# Patient Record
Sex: Female | Born: 1995 | Race: Black or African American | Hispanic: No | Smoking: Never smoker
Health system: Southern US, Community
[De-identification: ages and names within clinical notes are randomized; demographics above are authoritative.]

## PROBLEM LIST (undated history)

## (undated) ENCOUNTER — Inpatient Hospital Stay: Payer: Self-pay

## (undated) DIAGNOSIS — J45909 Unspecified asthma, uncomplicated: Secondary | ICD-10-CM

## (undated) HISTORY — PX: OTHER SURGICAL HISTORY: SHX169

## (undated) HISTORY — DX: Unspecified asthma, uncomplicated: J45.909

## (undated) HISTORY — PX: NO PAST SURGERIES: SHX2092

---
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
## 2016-11-11 ENCOUNTER — Encounter: Payer: Self-pay | Admitting: *Deleted

## 2016-11-11 ENCOUNTER — Emergency Department
Admission: EM | Admit: 2016-11-11 | Discharge: 2016-11-11 | Disposition: A | Discharge status: Home / Self Care | Payer: Medicaid Other | Attending: Emergency Medicine | Admitting: Emergency Medicine

## 2016-11-11 DIAGNOSIS — N760 Acute vaginitis: Secondary | ICD-10-CM | POA: Insufficient documentation

## 2016-11-11 DIAGNOSIS — F1290 Cannabis use, unspecified, uncomplicated: Secondary | ICD-10-CM | POA: Insufficient documentation

## 2016-11-11 DIAGNOSIS — B9689 Other specified bacterial agents as the cause of diseases classified elsewhere: Secondary | ICD-10-CM

## 2016-11-11 DIAGNOSIS — Z79899 Other long term (current) drug therapy: Secondary | ICD-10-CM | POA: Insufficient documentation

## 2016-11-11 LAB — CHLAMYDIA/NGC RT PCR (ARMC ONLY)
Chlamydia Tr: NOT DETECTED
N GONORRHOEAE: NOT DETECTED

## 2016-11-11 LAB — URINALYSIS, COMPLETE (UACMP) WITH MICROSCOPIC
Bacteria, UA: NONE SEEN
Bilirubin Urine: NEGATIVE
Glucose, UA: NEGATIVE mg/dL
Ketones, ur: NEGATIVE mg/dL
Leukocytes, UA: NEGATIVE
Nitrite: NEGATIVE
PROTEIN: NEGATIVE mg/dL
Specific Gravity, Urine: 1.021 (ref 1.005–1.030)
pH: 7 (ref 5.0–8.0)

## 2016-11-11 LAB — WET PREP, GENITAL
Sperm: NONE SEEN
Trich, Wet Prep: NONE SEEN
YEAST WET PREP: NONE SEEN

## 2016-11-11 LAB — POCT PREGNANCY, URINE: Preg Test, Ur: NEGATIVE

## 2016-11-11 MED ORDER — METRONIDAZOLE 500 MG PO TABS
500.0000 mg | ORAL_TABLET | Freq: Two times a day (BID) | ORAL | 0 refills | Status: DC
Start: 2016-11-11 — End: 2018-02-02

## 2016-11-11 NOTE — ED Provider Notes (Signed)
[facility] Emergency Department Provider Note  ____________________________________________   First MD Initiated Contact with Patient 11/11/16 1320     (approximate)  I have reviewed the triage vital signs and the nursing notes.   HISTORY  Chief Complaint Vaginal Discharge    HPI Gabrielle Howell is a [age] y.o. female is here with complaint of vaginal discharge for "a couple months". Patient states that she has had some brown discharge that calms every other week. She is complains of an odor with the discharge. Patient was placed on birth control at the [facility] and patient has not been there since October 2016.   History reviewed. No pertinent past medical history.  Patient Active Problem List   Diagnosis Date Noted  . Labor and delivery indication for care or intervention 06/09/2015  . Indication for care or intervention related to labor and delivery 06/09/2015    Past Surgical History:  Procedure Laterality Date  . NO PAST SURGERIES      Prior to Admission medications   Medication Sig Start Date End Date Taking? Authorizing Provider  ibuprofen (ADVIL,MOTRIN) 600 MG tablet Take 1 tablet (600 mg total) by mouth every 6 (six) hours. 06/11/15   Ihor Austinhomas J Schermerhorn, MD  metroNIDAZOLE (FLAGYL) 500 MG tablet Take 1 tablet (500 mg total) by mouth 2 (two) times daily. 11/11/16   Tommi Rumpshonda L Chaz Mcglasson, PA-C  Prenatal Vit-Fe Fumarate-FA (MULTIVITAMIN-PRENATAL) 27-0.8 MG TABS tablet Take 1 tablet by mouth daily at 12 noon.    Historical Provider, MD    Allergies Patient has no known allergies.  History reviewed. No pertinent family history.  Social History Social History  Substance Use Topics  . Smoking status: Never Smoker  . Smokeless tobacco: Never Used  . Alcohol use No    Review of Systems Constitutional: No fever/chills Cardiovascular: Denies chest pain. Respiratory: Denies shortness of breath. Gastrointestinal:  No abdominal pain.  No nausea, no vomiting.  Genitourinary: Negative for dysuria. Positive vaginal discharge. Musculoskeletal: Negative for back pain. Skin: Negative for rash. Neurological: Negative for headaches, focal weakness or numbness.  10-point ROS otherwise negative.  ____________________________________________   PHYSICAL EXAM:  VITAL SIGNS: ED Triage Vitals [11/11/16 1256]  Enc Vitals Group     BP 108/63     Pulse Rate 78     Resp 18     Temp 98 F (36.7 C)     Temp Source Oral     SpO2 100 %     Weight 180 lb (81.6 kg)     Height 5\' 5"  (1.651 m)     Head Circumference      Peak Flow      Pain Score 5     Pain Loc      Pain Edu?      Excl. in GC?     Constitutional: Alert and oriented. Well appearing and in no acute distress. Eyes: Conjunctivae are normal. PERRL. EOMI. Head: Atraumatic. Nose: No congestion/rhinnorhea. Neck: No stridor.   Cardiovascular: Normal rate, regular rhythm. Grossly normal heart sounds.  Good peripheral circulation. Respiratory: Normal respiratory effort.  No retractions. Lungs CTAB. Gastrointestinal: Soft and nontender. No distention.  Genitourinary: External vaginal exam is unremarkable. Vaginal exam showed dark menstrual blood. Minimal adnexal tenderness but no masses were noted. There is minimal cervical tenderness but negative chandelier sign. Musculoskeletal: No lower extremity tenderness nor edema.  No joint effusions. Neurologic:  Normal speech and language. No gross focal neurologic deficits are appreciated. No gait  instability. Skin:  Skin is warm, dry and intact. No rash noted. Psychiatric: Mood and affect are normal. Speech and behavior are normal.  ____________________________________________   LABS (all labs ordered are listed, but only abnormal results are displayed)  Labs Reviewed  WET PREP, GENITAL - Abnormal; Notable for the following:       Result Value   Clue Cells Wet Prep HPF POC PRESENT (*)    WBC, Wet  Prep HPF POC FEW (*)    All other components within normal limits  URINALYSIS, COMPLETE (UACMP) WITH MICROSCOPIC - Abnormal; Notable for the following:    Color, Urine YELLOW (*)    APPearance CLEAR (*)    Hgb urine dipstick SMALL (*)    Squamous Epithelial / LPF 0-5 (*)    All other components within normal limits  CHLAMYDIA/NGC RT PCR ([facility] ONLY)  POC URINE PREG, ED  POCT PREGNANCY, URINE     PROCEDURES  Procedure(s) performed: None  Procedures  Critical Care performed: No  ____________________________________________   INITIAL IMPRESSION / ASSESSMENT AND PLAN / ED COURSE  Pertinent labs & imaging results that were available during my care of the patient were reviewed by me and considered in my medical decision making (see chart for details).  Wet prep was positive for clue cells. Patient was made aware that she has a bacterial vaginosis. Phone number was confirmed and patient was told that should her Chlamydia or gonorrhea test be positive she would be notified. She is encouraged to follow-up with the [facility] health Department for any continued problems with vaginal discharge. Patient was placed on Flagyl 500 mg twice a day for 7 days.      ____________________________________________   FINAL CLINICAL IMPRESSION(S) / ED DIAGNOSES  Final diagnoses:  BV (bacterial vaginosis)      NEW MEDICATIONS STARTED DURING THIS VISIT:  Discharge Medication List as of 11/11/2016  3:17 PM    START taking these medications   Details  metroNIDAZOLE (FLAGYL) 500 MG tablet Take 1 tablet (500 mg total) by mouth 2 (two) times daily., Starting Wed 11/11/2016, Print         Note:  This document was prepared using Dragon voice recognition software and may include unintentional dictation errors.    Tommi Rumps, PA-C 11/11/16 1749    Jeanmarie Plant, MD 11/19/16 [phone]

## 2016-11-11 NOTE — ED Triage Notes (Signed)
States brown foul smelling vaginal discharge for a couple of months

## 2016-11-11 NOTE — ED Notes (Signed)
Pt states placed on BC after having children. Pt states pelvic pain and brown discharge. [facility] Pain comes and goes, discharge "every other week." Pt has mirena.

## 2016-11-11 NOTE — Discharge Instructions (Signed)
Follow[facility]. Been taking Flagyl as directed. Do not drink alcohol or take medications containing alcohol such as cough medication. Cultures for gonorrhea and chlamydia have not resulted today and usually called if further treatment is indicated.

## 2016-12-30 IMAGING — US TRANSABDOMINAL ULTRASOUND OF PELVIS
1 series · 14 of 25 positions shown · non-contrast
Comparison: No priors.

CLINICAL DATA: 18-year-old female with history of pelvic pain since
this morning.

EXAM:
TRANSABDOMINAL ULTRASOUND OF PELVIS
TECHNIQUE: Transabdominal ultrasound examination of the pelvis was performed
including evaluation of the uterus, ovaries, adnexal regions, and
pelvic cul-de-sac.

[Series 1: transabdominal ultrasound of pelvis · 0.17mm/px · 14 of 40 slices shown]
[im 1/40]
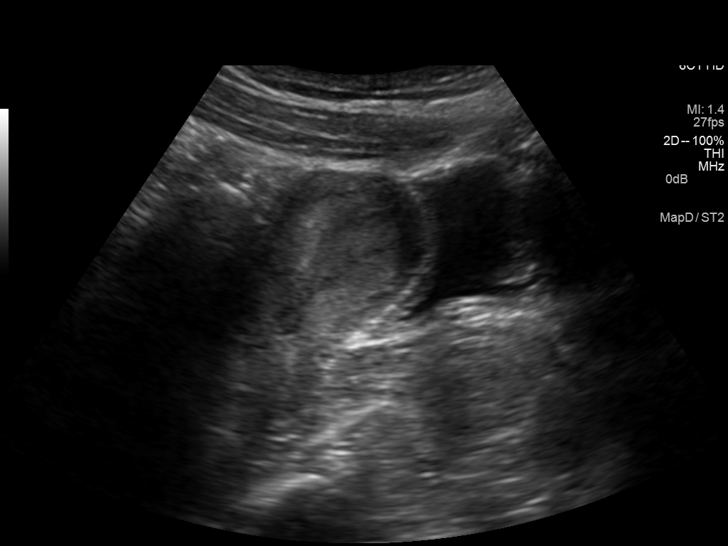
[im 4/40]
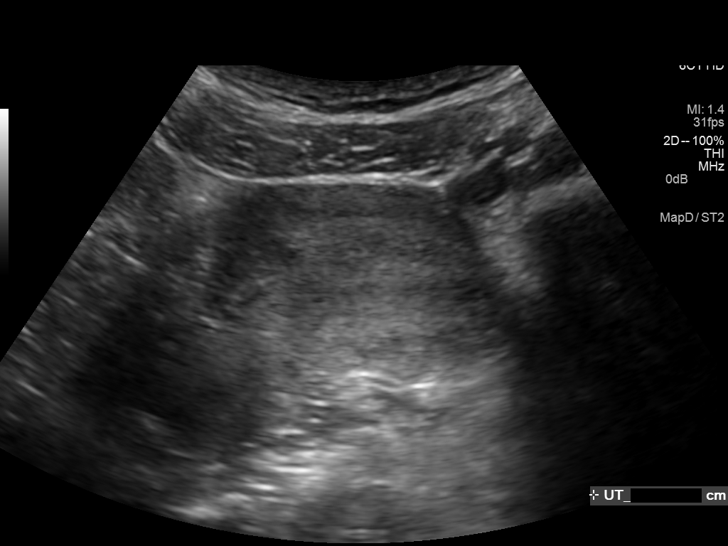
[im 7/40]
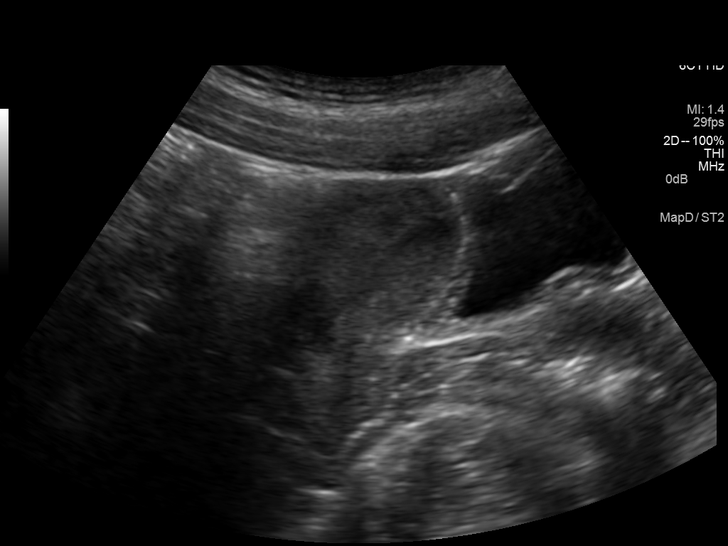
[im 10/40]
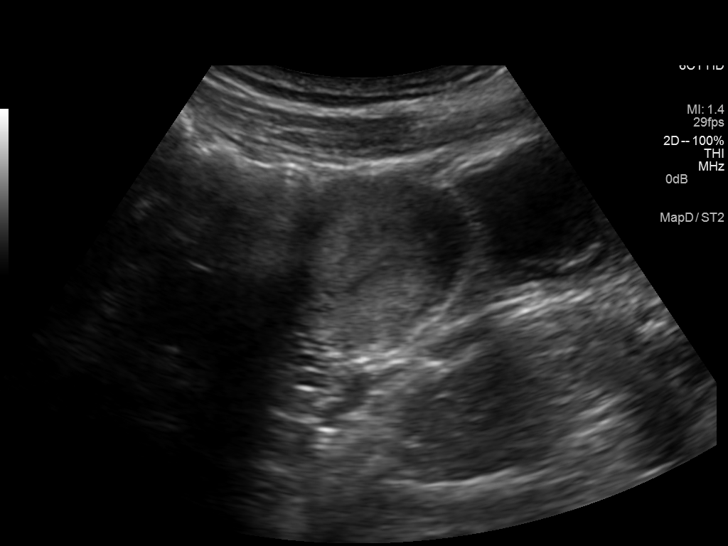
[im 14/40]
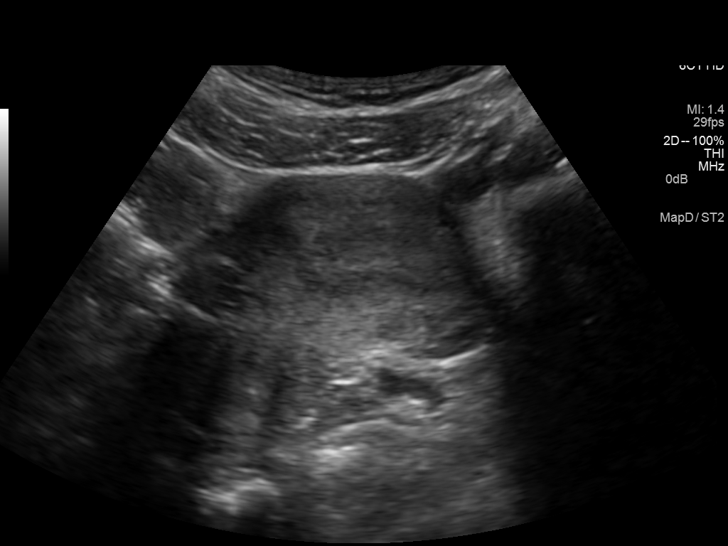
[im 15/40]
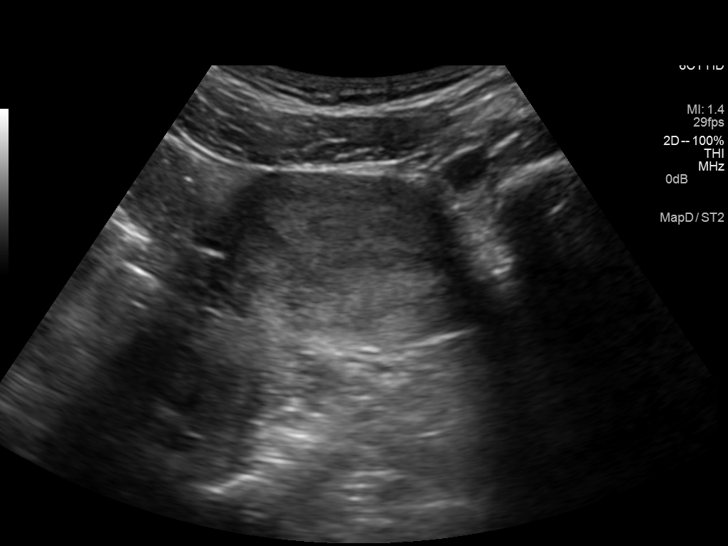
[im 18/40]
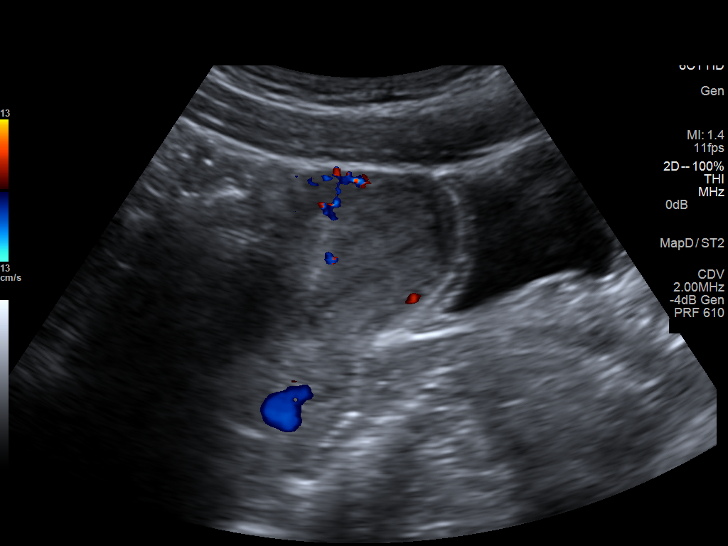
[im 22/40]
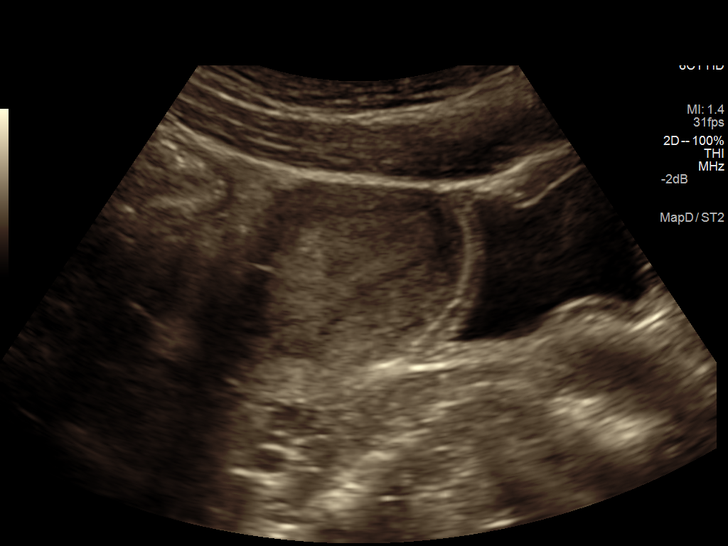
[im 25/40]
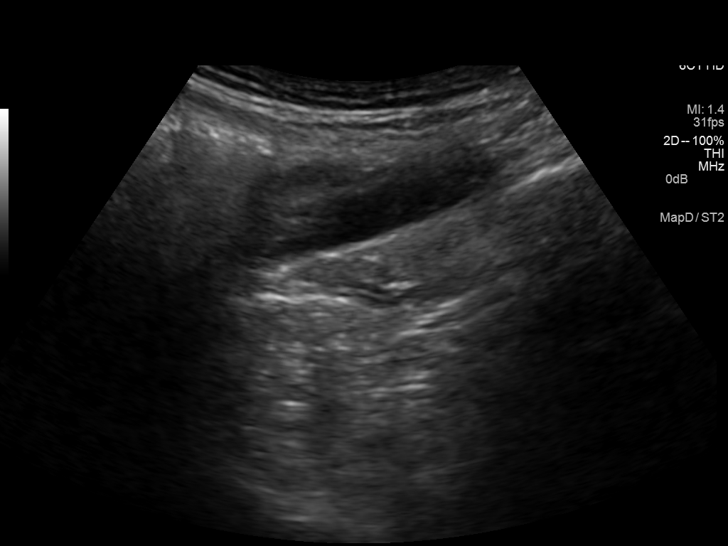
[im 27/40]
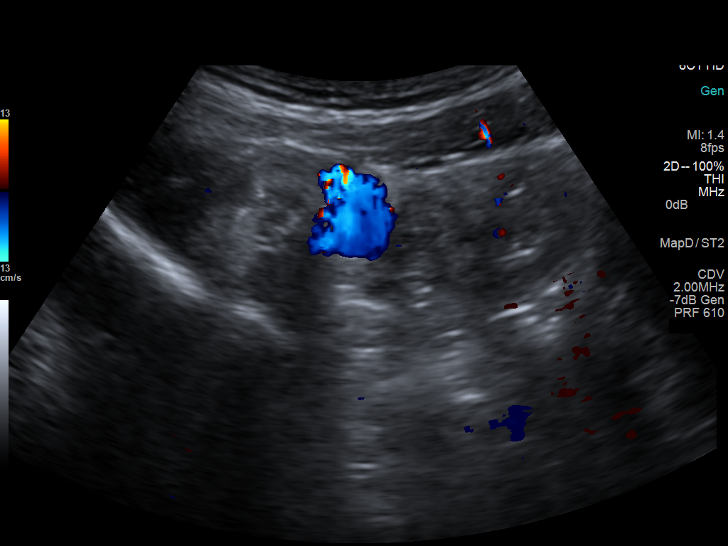
[im 30/40]
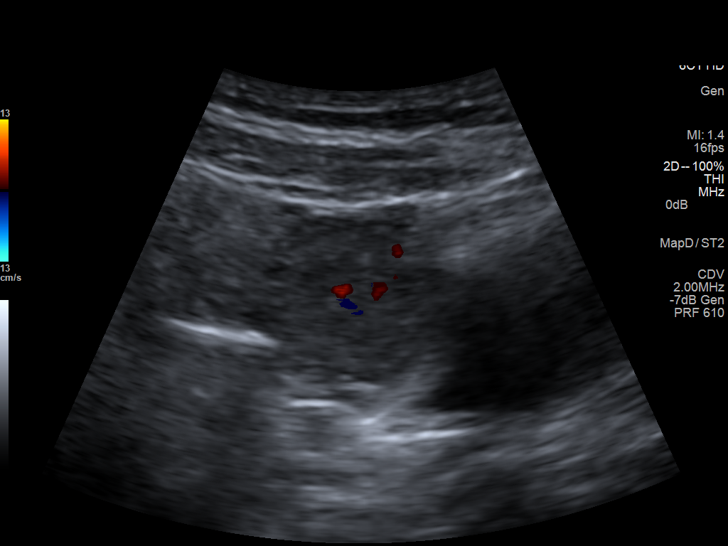
[im 33/40]
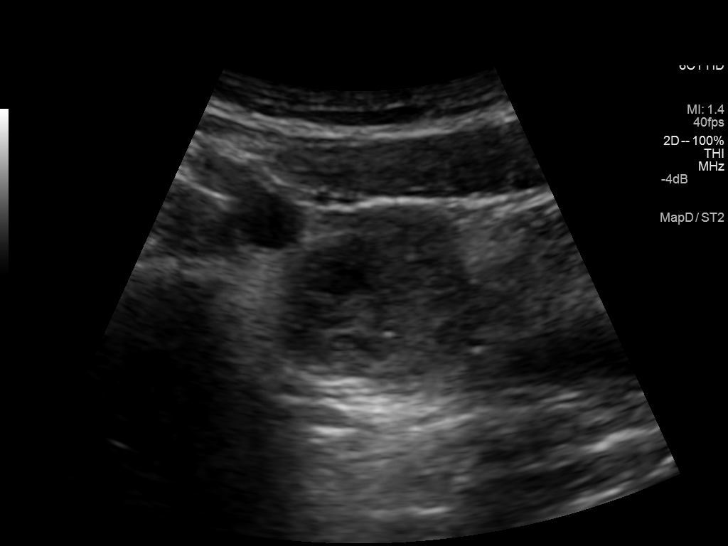
[im 36/40]
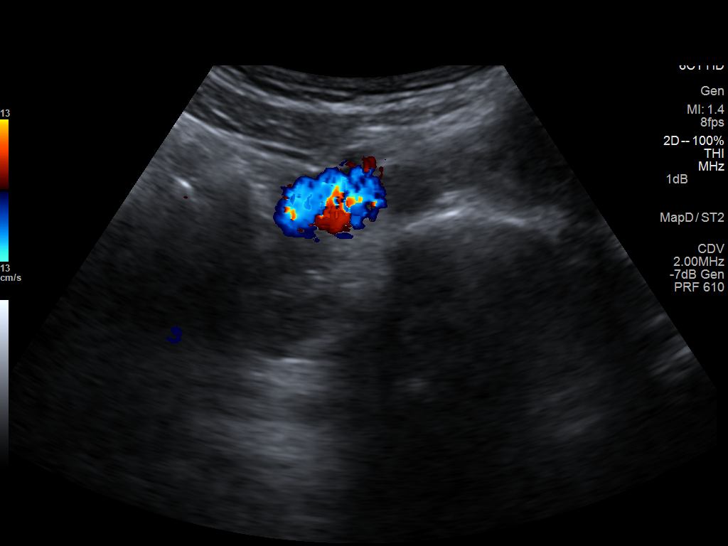
[im 40/40]
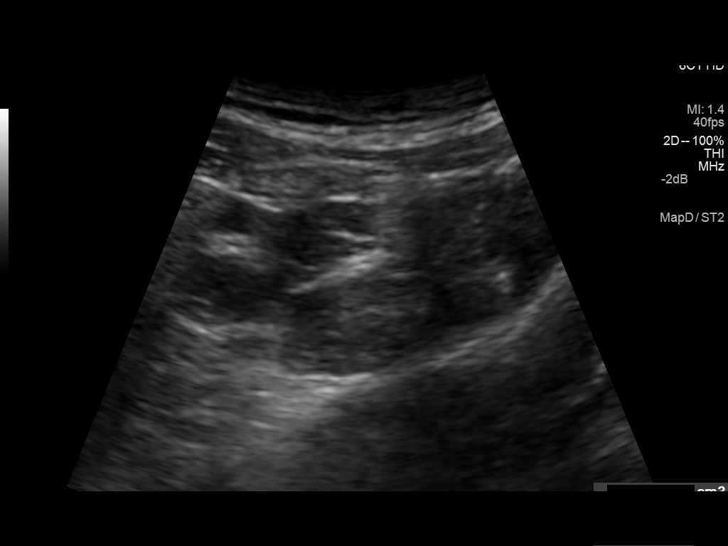

[14 of 25 positions shown; findings below may reference images not displayed]

FINDINGS: Uterus

Measurements: 7.3 x 3.8 x 5.7 cm. No fibroids or other mass
visualized.

Endometrium

Thickness: 15 mm.  No focal abnormality visualized.

Right ovary

Measurements: 3.0 x 2.5 x 2.7 cm. Normal appearance/no adnexal mass.

Left ovary

Measurements: 2.8 x 2.4 x 1.5 cm. Normal appearance/no adnexal mass.

Other findings:  No free fluid
IMPRESSION: 1. Normal transabdominal pelvic ultrasound.

## 2017-10-09 LAB — OB RESULTS CONSOLE GC/CHLAMYDIA: Gonorrhea: NEGATIVE

## 2018-02-02 ENCOUNTER — Other Ambulatory Visit: Payer: Self-pay

## 2018-02-02 ENCOUNTER — Emergency Department
Admission: EM | Admit: 2018-02-02 | Discharge: 2018-02-02 | Disposition: A | Discharge status: Home / Self Care | Payer: Self-pay | Attending: Emergency Medicine | Admitting: Emergency Medicine

## 2018-02-02 ENCOUNTER — Emergency Department: Payer: Self-pay

## 2018-02-02 ENCOUNTER — Encounter: Payer: Self-pay | Admitting: Emergency Medicine

## 2018-02-02 DIAGNOSIS — N760 Acute vaginitis: Secondary | ICD-10-CM | POA: Insufficient documentation

## 2018-02-02 DIAGNOSIS — R102 Pelvic and perineal pain: Secondary | ICD-10-CM

## 2018-02-02 DIAGNOSIS — A549 Gonococcal infection, unspecified: Secondary | ICD-10-CM | POA: Insufficient documentation

## 2018-02-02 DIAGNOSIS — B9689 Other specified bacterial agents as the cause of diseases classified elsewhere: Secondary | ICD-10-CM | POA: Insufficient documentation

## 2018-02-02 LAB — COMPREHENSIVE METABOLIC PANEL
ALBUMIN: 4.1 g/dL (ref 3.5–5.0)
ALT: 15 U/L (ref 14–54)
ANION GAP: 6 (ref 5–15)
AST: 21 U/L (ref 15–41)
Alkaline Phosphatase: 44 U/L (ref 38–126)
BUN: 12 mg/dL (ref 6–20)
CO2: 28 mmol/L (ref 22–32)
Calcium: 9.2 mg/dL (ref 8.9–10.3)
Chloride: 102 mmol/L (ref 101–111)
Creatinine, Ser: 0.62 mg/dL (ref 0.44–1.00)
GFR calc Af Amer: >60 mL/min (ref >60)
GFR calc non Af Amer: >60 mL/min (ref >60)
GLUCOSE: 89 mg/dL (ref 65–99)
POTASSIUM: 3.9 mmol/L (ref 3.5–5.1)
Sodium: 136 mmol/L (ref 135–145)
Total Bilirubin: 0.8 mg/dL (ref 0.3–1.2)
Total Protein: 7.7 g/dL (ref 6.5–8.1)

## 2018-02-02 LAB — POCT PREGNANCY, URINE: PREG TEST UR: NEGATIVE

## 2018-02-02 LAB — URINALYSIS, COMPLETE (UACMP) WITH MICROSCOPIC
BACTERIA UA: NONE SEEN
BILIRUBIN URINE: NEGATIVE
Glucose, UA: NEGATIVE mg/dL
Hgb urine dipstick: NEGATIVE
KETONES UR: 80 mg/dL — AB
LEUKOCYTES UA: NEGATIVE
Nitrite: NEGATIVE
Protein, ur: NEGATIVE mg/dL
SPECIFIC GRAVITY, URINE: 1.024 (ref 1.005–1.030)
pH: 7 (ref 5.0–8.0)

## 2018-02-02 LAB — CBC WITH DIFFERENTIAL/PLATELET
BASOS PCT: 1 %
Basophils Absolute: 0 10*3/uL (ref 0–0.1)
Eosinophils Absolute: 0.1 10*3/uL (ref 0–0.7)
Eosinophils Relative: 2 %
HCT: 39.6 % (ref 35.0–47.0)
HEMOGLOBIN: 13.8 g/dL (ref 12.0–16.0)
Lymphocytes Relative: 38 %
Lymphs Abs: 1.4 10*3/uL (ref 1.0–3.6)
MCH: 32 pg (ref 26.0–34.0)
MCHC: 34.7 g/dL (ref 32.0–36.0)
MCV: 92.2 fL (ref 80.0–100.0)
MONO ABS: 0.5 10*3/uL (ref 0.2–0.9)
MONOS PCT: 13 %
NEUTROS PCT: 46 %
Neutro Abs: 1.7 10*3/uL (ref 1.4–6.5)
Platelets: 240 10*3/uL (ref 150–440)
RBC: 4.29 MIL/uL (ref 3.80–5.20)
RDW: 13.2 % (ref 11.5–14.5)
WBC: 3.7 10*3/uL (ref 3.6–11.0)

## 2018-02-02 LAB — WET PREP, GENITAL
SPERM: NONE SEEN
Trich, Wet Prep: NONE SEEN
YEAST WET PREP: NONE SEEN

## 2018-02-02 LAB — CHLAMYDIA/NGC RT PCR (ARMC ONLY)
Chlamydia Tr: NOT DETECTED
N gonorrhoeae: DETECTED — AB

## 2018-02-02 LAB — LIPASE, BLOOD: Lipase: 23 U/L (ref 11–51)

## 2018-02-02 MED ORDER — AZITHROMYCIN 500 MG PO TABS
1000.0000 mg | ORAL_TABLET | Freq: Once | ORAL | Status: AC
  Administered 2018-02-02: 1000 mg via ORAL
  Filled 2018-02-02: qty 2

## 2018-02-02 MED ORDER — DOXYCYCLINE HYCLATE 100 MG PO CAPS: 100.0000 mg | ORAL_CAPSULE | Freq: Two times a day (BID) | ORAL | 0 refills | Status: DC

## 2018-02-02 MED ORDER — CEFTRIAXONE SODIUM 250 MG IJ SOLR
INTRAMUSCULAR | Status: AC
  Administered 2018-02-02: 250 mg via INTRAMUSCULAR
  Filled 2018-02-02: qty 250

## 2018-02-02 MED ORDER — METRONIDAZOLE 500 MG PO TABS: 500.0000 mg | ORAL_TABLET | Freq: Two times a day (BID) | ORAL | 0 refills | Status: DC

## 2018-02-02 MED ORDER — DEXTROSE 5 % IV SOLN
250.0000 mg | Freq: Once | INTRAVENOUS | Status: DC
  Filled 2018-02-02: qty 250

## 2018-02-02 MED ORDER — CEFTRIAXONE SODIUM 250 MG IJ SOLR
250.0000 mg | Freq: Once | INTRAMUSCULAR | Status: AC
  Administered 2018-02-02: 250 mg via INTRAMUSCULAR

## 2018-02-02 NOTE — ED Triage Notes (Signed)
Pt to ED via POV c/o pelvic pain since Monday. Pt denies vaginal discharge or bleeding. Pt states that she does have painful urination at times. Pt in NAD at this time.

## 2018-02-02 NOTE — ED Provider Notes (Signed)
[facility] Emergency Department Provider Note  ____________________________________________   I have reviewed the triage vital signs and the nursing notes. Where available I have reviewed prior notes and, if possible and indicated, outside hospital notes.    HISTORY  Chief Complaint Pelvic Pain    HPI JOSHUA ZERINGUE is a 22 y.o. female a history of chronic recurrent pelvic pain, she had an IUD placed, but then had the IUD taken away because she is still having the pain.  She is here with her normal pain.  Is suprapubic.  Denies any nausea or vomiting hematuria, it has been there gradually for the last couple days.  Not worst pain of life.  Not sudden onset.  No radiation.  Nothing makes it better nothing makes it worse.  Denies pregnancy.  Denies vaginal discharge.  Denies any other symptoms.  Nothing makes it better nothing makes it worse.  No antecedent treatment.  Pain is mild to moderate.  This is the same pain she "always gets".  Is sexually active.  Not use any birth control.  No history of STI that she knows of.  She is G1, P0 does not believe she is pregnant.  Last menstrual period was in mid April.     History reviewed. No pertinent past medical history.  Patient Active Problem List   Diagnosis Date Noted  . Labor and delivery indication for care or intervention 06/09/2015  . Indication for care or intervention related to labor and delivery 06/09/2015    Past Surgical History:  Procedure Laterality Date  . NO PAST SURGERIES      Prior to Admission medications   Not on File    Allergies Patient has no known allergies.  No family history on file.  Social History Social History   Tobacco Use  . Smoking status: Never Smoker  . Smokeless tobacco: Never Used  Substance Use Topics  . Alcohol use: No  . Drug use: Yes    Types: Marijuana    Review of Systems Constitutional: No fever/chills Eyes: No visual changes. ENT: No sore  throat. No stiff neck no neck pain Cardiovascular: Denies chest pain. Respiratory: Denies shortness of breath. Gastrointestinal:   no vomiting.  No diarrhea.  No constipation. Genitourinary: Negative for dysuria. Musculoskeletal: Negative lower extremity swelling Skin: Negative for rash. Neurological: Negative for severe headaches, focal weakness or numbness.   ____________________________________________   PHYSICAL EXAM:  VITAL SIGNS: ED Triage Vitals  Enc Vitals Group     BP 02/02/18 0848 120/74     Pulse Rate 02/02/18 0848 72     Resp 02/02/18 0848 18     Temp 02/02/18 0848 (!) 97.5 F (36.4 C)     Temp Source 02/02/18 0848 Oral     SpO2 02/02/18 0848 100 %     Weight 02/02/18 0850 185 lb (83.9 kg)     Height --      Head Circumference --      Peak Flow --      Pain Score 02/02/18 0850 5     Pain Loc --      Pain Edu? --      Excl. in GC? --     Constitutional: Alert and oriented. Well appearing and in no acute distress. Eyes: Conjunctivae are normal Head: Atraumatic HEENT: No congestion/rhinnorhea. Mucous membranes are moist.  Oropharynx non-erythematous Neck:   Nontender with no meningismus, no masses, no stridor Cardiovascular: Normal rate, regular rhythm. Grossly normal heart sounds.  Good  peripheral circulation. Respiratory: Normal respiratory effort.  No retractions. Lungs CTAB. Abdominal: Soft and slight suprapubic discomfort with no guarding no rebound no tenderness at McBurney's point,. No distention. No guarding no rebound Back:  There is no focal tenderness or step off.  there is no midline tenderness there are no lesions noted. there is no CVA tenderness Pelvic exam: Female nurse chaperone present, no external lesions noted, physiologic vaginal discharge noted with no purulent discharge, no cervical motion tenderness, no adnexal tenderness or mass, there is mild uterine tenderness, there is no mass appreciated, no vaginal bleeding Musculoskeletal: No lower  extremity tenderness, no upper extremity tenderness. No joint effusions, no DVT signs strong distal pulses no edema Neurologic:  Normal speech and language. No gross focal neurologic deficits are appreciated.  Skin:  Skin is warm, dry and intact. No rash noted. Psychiatric: Mood and affect are normal. Speech and behavior are normal.  ____________________________________________   LABS (all labs ordered are listed, but only abnormal results are displayed)  Labs Reviewed  WET PREP, GENITAL - Abnormal; Notable for the following components:      Result Value   Clue Cells Wet Prep HPF POC PRESENT (*)    WBC, Wet Prep HPF POC FEW (*)    All other components within normal limits  URINALYSIS, COMPLETE (UACMP) WITH MICROSCOPIC - Abnormal; Notable for the following components:   Color, Urine YELLOW (*)    APPearance CLEAR (*)    Ketones, ur 80 (*)    All other components within normal limits  CHLAMYDIA/NGC RT PCR ([facility] ONLY)  CBC WITH DIFFERENTIAL/PLATELET  COMPREHENSIVE METABOLIC PANEL  LIPASE, BLOOD  POC URINE PREG, ED  POCT PREGNANCY, URINE    Pertinent labs  results that were available during my care of the patient were reviewed by me and considered in my medical decision making (see chart for details). ____________________________________________  EKG  I personally interpreted any EKGs ordered by me or triage  ____________________________________________  RADIOLOGY  Pertinent labs & imaging results that were available during my care of the patient were reviewed by me and considered in my medical decision making (see chart for details). If possible, patient and/or family made aware of any abnormal findings.  No results found. ____________________________________________    PROCEDURES  Procedure(s) performed: None  Procedures  Critical Care performed: None  ____________________________________________   INITIAL IMPRESSION / ASSESSMENT AND PLAN / ED  COURSE  Pertinent labs & imaging results that were available during my care of the patient were reviewed by me and considered in my medical decision making (see chart for details).  Patient with chronic recurrent pelvic pain has been there off and on for several years we have not had imaging on her in several years however.  Will obtain an ultrasound blood work is reassuring, she does have BV which certainly could be the cause of her symptoms.    ____________________________________________   FINAL CLINICAL IMPRESSION(S) / ED DIAGNOSES  Final diagnoses:  Pelvic pain      This chart was dictated using voice recognition software.  Despite best efforts to proofread,  errors can occur which can change meaning.      Jeanmarie Plant, MD 02/02/18 1017

## 2018-02-02 NOTE — ED Notes (Signed)
Pt alert and oriented X4, active, cooperative, pt in NAD. RR even and unlabored, color WNL.  Pt informed to return if any life threatening symptoms occur.  Discharge and followup instructions reviewed.  

## 2018-02-02 NOTE — ED Notes (Signed)
Patient transported to Ultrasound 

## 2018-02-02 NOTE — ED Notes (Signed)
Pt is AOx4, she does not show any signs of distress, in bed with rails upx2. Pelvic exam equipment has been set up. Pt denies any chest pain, shortness of breath or hematuria. We will continue to monitor the pt.

## 2018-02-02 NOTE — Discharge Instructions (Signed)
Please make all of your sexual partners aware of these findings, do not have any further sexual activity with anyone until they are treated.  If you have increased pain, fever vomiting return to the emergency room.  Take Tylenol Motrin for the pain.

## 2018-03-15 LAB — OB RESULTS CONSOLE HIV ANTIBODY (ROUTINE TESTING): HIV: NONREACTIVE

## 2018-03-16 ENCOUNTER — Other Ambulatory Visit (HOSPITAL_COMMUNITY): Payer: Self-pay | Admitting: Family Medicine

## 2018-03-16 DIAGNOSIS — Z369 Encounter for antenatal screening, unspecified: Secondary | ICD-10-CM

## 2018-03-16 LAB — OB RESULTS CONSOLE HEPATITIS B SURFACE ANTIGEN: Hepatitis B Surface Ag: NEGATIVE

## 2018-03-16 LAB — OB RESULTS CONSOLE RPR: RPR: NONREACTIVE

## 2018-04-18 ENCOUNTER — Ambulatory Visit
Admission: RE | Admit: 2018-04-18 | Discharge: 2018-04-18 | Disposition: A | Discharge status: Home / Self Care | Payer: Self-pay | Source: Ambulatory Visit | Attending: Family Medicine | Admitting: Family Medicine

## 2018-04-18 ENCOUNTER — Ambulatory Visit
Admission: RE | Admit: 2018-04-18 | Discharge: 2018-04-18 | Disposition: A | Discharge status: Home / Self Care | Payer: Self-pay | Source: Ambulatory Visit | Attending: Maternal & Fetal Medicine | Admitting: Maternal & Fetal Medicine

## 2018-04-18 DIAGNOSIS — Z369 Encounter for antenatal screening, unspecified: Secondary | ICD-10-CM | POA: Insufficient documentation

## 2018-04-18 DIAGNOSIS — Z3A12 12 weeks gestation of pregnancy: Secondary | ICD-10-CM | POA: Insufficient documentation

## 2018-04-18 NOTE — Progress Notes (Signed)
Met with Gabrielle Howell and her partner, as they were scheduled for genetic counseling and first trimester screening.  Patient disclosed that she is not interested in first trimester screening or other genetic testing in pregnancy.  For this reason, she elected to decline genetic counseling and all screening/testing.  She will plan to proceed with ultrasound only today.

## 2018-05-31 ENCOUNTER — Other Ambulatory Visit: Payer: Self-pay | Admitting: Maternal & Fetal Medicine

## 2018-05-31 DIAGNOSIS — Z3689 Encounter for other specified antenatal screening: Secondary | ICD-10-CM

## 2018-06-02 ENCOUNTER — Ambulatory Visit
Admission: RE | Admit: 2018-06-02 | Discharge: 2018-06-02 | Disposition: A | Discharge status: Home / Self Care | Payer: Medicaid Other | Source: Ambulatory Visit | Attending: Maternal & Fetal Medicine | Admitting: Maternal & Fetal Medicine

## 2018-06-02 DIAGNOSIS — Z3A18 18 weeks gestation of pregnancy: Secondary | ICD-10-CM | POA: Diagnosis not present

## 2018-06-02 DIAGNOSIS — Z3689 Encounter for other specified antenatal screening: Secondary | ICD-10-CM | POA: Diagnosis not present

## 2018-08-11 LAB — HM HIV SCREENING LAB: HM HIV Screening: NEGATIVE

## 2018-09-01 ENCOUNTER — Observation Stay
Admission: EM | Admit: 2018-09-01 | Discharge: 2018-09-01 | Disposition: A | Discharge status: Home / Self Care | Payer: Medicaid Other | Attending: Obstetrics and Gynecology | Admitting: Obstetrics and Gynecology

## 2018-09-01 ENCOUNTER — Other Ambulatory Visit: Payer: Self-pay

## 2018-09-01 DIAGNOSIS — O98213 Gonorrhea complicating pregnancy, third trimester: Secondary | ICD-10-CM | POA: Diagnosis present

## 2018-09-01 DIAGNOSIS — B3731 Acute candidiasis of vulva and vagina: Secondary | ICD-10-CM

## 2018-09-01 DIAGNOSIS — B379 Candidiasis, unspecified: Secondary | ICD-10-CM | POA: Diagnosis not present

## 2018-09-01 DIAGNOSIS — N898 Other specified noninflammatory disorders of vagina: Secondary | ICD-10-CM | POA: Diagnosis present

## 2018-09-01 DIAGNOSIS — Z3A31 31 weeks gestation of pregnancy: Secondary | ICD-10-CM | POA: Insufficient documentation

## 2018-09-01 DIAGNOSIS — O98813 Other maternal infectious and parasitic diseases complicating pregnancy, third trimester: Secondary | ICD-10-CM | POA: Diagnosis not present

## 2018-09-01 DIAGNOSIS — O26893 Other specified pregnancy related conditions, third trimester: Secondary | ICD-10-CM

## 2018-09-01 DIAGNOSIS — M549 Dorsalgia, unspecified: Secondary | ICD-10-CM

## 2018-09-01 DIAGNOSIS — B373 Candidiasis of vulva and vagina: Secondary | ICD-10-CM

## 2018-09-01 DIAGNOSIS — R399 Unspecified symptoms and signs involving the genitourinary system: Secondary | ICD-10-CM

## 2018-09-01 LAB — URINALYSIS, ROUTINE W REFLEX MICROSCOPIC
BILIRUBIN URINE: NEGATIVE
Bacteria, UA: NONE SEEN
GLUCOSE, UA: NEGATIVE mg/dL
Hgb urine dipstick: NEGATIVE
KETONES UR: 5 mg/dL — AB
LEUKOCYTES UA: NEGATIVE
NITRITE: NEGATIVE
PH: 6 (ref 5.0–8.0)
PROTEIN: 30 mg/dL — AB
Specific Gravity, Urine: 1.03 (ref 1.005–1.030)

## 2018-09-01 LAB — CHLAMYDIA/NGC RT PCR (ARMC ONLY)
CHLAMYDIA TR: NOT DETECTED
N gonorrhoeae: DETECTED — AB

## 2018-09-01 LAB — WET PREP, GENITAL
Clue Cells Wet Prep HPF POC: NONE SEEN
Trich, Wet Prep: NONE SEEN

## 2018-09-01 MED ORDER — ACETAMINOPHEN 500 MG PO TABS: 1000.0000 mg | ORAL_TABLET | Freq: Four times a day (QID) | ORAL | 0 refills | Status: DC | PRN

## 2018-09-01 MED ORDER — FLUCONAZOLE 150 MG PO TABS: 150.0000 mg | ORAL_TABLET | ORAL | 0 refills | Status: DC

## 2018-09-01 MED ORDER — ACETAMINOPHEN 500 MG PO TABS
1000.0000 mg | ORAL_TABLET | Freq: Four times a day (QID) | ORAL | Status: DC | PRN
Start: 2018-09-01 — End: 2018-09-01
  Administered 2018-09-01: 1000 mg via ORAL
  Filled 2018-09-01: qty 2

## 2018-09-01 MED ORDER — FLUCONAZOLE 50 MG PO TABS
150.0000 mg | ORAL_TABLET | Freq: Once | ORAL | Status: AC
  Administered 2018-09-01: 150 mg via ORAL
  Filled 2018-09-01: qty 3

## 2018-09-01 MED ORDER — NITROFURANTOIN MONOHYD MACRO 100 MG PO CAPS: 100.0000 mg | ORAL_CAPSULE | Freq: Two times a day (BID) | ORAL | 0 refills | Status: DC

## 2018-09-01 MED ORDER — NITROFURANTOIN MONOHYD MACRO 100 MG PO CAPS
ORAL_CAPSULE | ORAL | Status: AC
  Filled 2018-09-01: qty 1

## 2018-09-01 MED ORDER — NITROFURANTOIN MONOHYD MACRO 100 MG PO CAPS
100.0000 mg | ORAL_CAPSULE | Freq: Two times a day (BID) | ORAL | Status: DC
  Administered 2018-09-01: 100 mg via ORAL

## 2018-09-01 NOTE — Final Progress Note (Signed)
L&D OB Triage Note  Gabrielle Howell is a 22 y.o. G2P1001 unassigned female at 4015w4d, EDD Estimated Date of Delivery: 10/30/18 who presented to triage for complaints of vaginal pressure and back pain. She receives prenatal care at [facility]. She notes symptoms began at ~ 0300 this morning, pain is 8/10.  She also notes complaints of pain with urination. Reports that she was recently treated for a yeast infection. She was evaluated by the nurses with no significant findings for preterm labor. Vital signs stable. An NST was performed and has been reviewed by MD. She was treated with PO Tylenol 1000 mg, a dose of Diflucan and initiated on Macrobid for suspected UTI.   NST INTERPRETATION: Indications: rule out uterine contractions  Mode: External Baseline Rate (A): 145 bpm Variability: Moderate Accelerations: 15 x 15 Decelerations: None     Contraction Frequency (min): none  Impression: reactive   Labs:  Results for orders placed or performed during the hospital encounter of 09/01/18  Wet prep, genital  Result Value Ref Range   Yeast Wet Prep HPF POC PRESENT (A) NONE SEEN   Trich, Wet Prep NONE SEEN NONE SEEN   Clue Cells Wet Prep HPF POC NONE SEEN NONE SEEN   WBC, Wet Prep HPF POC MODERATE (A) NONE SEEN   Sperm PRESENT   Chlamydia/NGC rt PCR ([facility] only)  Result Value Ref Range   Specimen source GC/Chlam ENDOCERVICAL    Chlamydia Tr NOT DETECTED NOT DETECTED   N gonorrhoeae DETECTED (A) NOT DETECTED  Urinalysis, Routine w reflex microscopic  Result Value Ref Range   Color, Urine YELLOW (A) YELLOW   APPearance CLEAR (A) CLEAR   Specific Gravity, Urine 1.030 1.005 - 1.030   pH 6.0 5.0 - 8.0   Glucose, UA NEGATIVE NEGATIVE mg/dL   Hgb urine dipstick NEGATIVE NEGATIVE   Bilirubin Urine NEGATIVE NEGATIVE   Ketones, ur 5 (A) NEGATIVE mg/dL   Protein, ur 30 (A) NEGATIVE mg/dL   Nitrite NEGATIVE NEGATIVE   Leukocytes, UA NEGATIVE NEGATIVE   RBC / HPF 0-5 0 - 5 RBC/hpf   WBC, UA 0-5 0 -  5 WBC/hpf   Bacteria, UA NONE SEEN NONE SEEN   Squamous Epithelial / LPF 0-5 0 - 5   Mucus PRESENT      Plan: NST performed was reviewed and was found to be reactive. Her pain improved with Tylenol. She was discharged home with PTL precautions.  She was treated for yeast infection with dose of Diflucan, and discharged with a prescription for repeat dosing as needed.  She was also given a prescription for Macrobid for urinary symptoms (although UA not fully suggestive of UTI).  Continue routine prenatal care. [facility].   Addendum at 1045 a.m.:   Contacted by nurse who noted that patient's GC/CL had returned from lab, and was positive for gonorrhea after patient had been discharged.  Contacted patient to inform her of results and need for treatment for herself and partner.  Advised that she can return to the hospital for treatmetn, or [facility].  Patient states she will go to the [facility].    Hildred LaserAnika Donell Sliwinski, MD [facility]

## 2018-09-01 NOTE — Discharge Instructions (Signed)

## 2018-09-01 NOTE — OB Triage Note (Signed)
Pt is a 32wk3d G2P1 seen in triage for vaginal pressure and back pain. Pt states pain started around 0300 this morning. Pt rates pain at a 8/10.  Pt also complains of pain w/ urination. Pt denies vaginal bleeding and LOF. Pt states positive fetal movement. Monitors applied and assessing.

## 2018-09-01 NOTE — Progress Notes (Signed)
Dr Valentino Saxonherry notified of positive Gonorrhea result that resulted after pt was d/c home. Nurse called pt cell phone and informed her of her results and to schedule an appointment with health department or return to hospital for treatment per Dr. Valentino Saxonherry. Pt verbalized understanding and stated "I will call the health department right now and get an appointment."

## 2018-09-01 NOTE — OB Triage Note (Signed)
Pt states that her symptoms started about 3am, this morning. Shes having flank pain, burning with urination, and pressure and some discharge that is clear and white that itches, She had a yeast infection and was treated with medication cream about 3 weeks ago. The yeast infection went away but came back and the health dept was suppose to give her more cream but they were out.

## 2018-09-01 NOTE — OB Triage Note (Signed)
Pt d/c home in stable condition. Pt instructed to follow up with Health Department. Pt verbalized understanding of D/C instructions.

## 2018-09-28 NOTE — L&D Delivery Note (Signed)
       Delivery Note   Gabrielle Howell is a 23 y.o. G2P1001 at 912w4d Estimated Date of Delivery: 10/30/18  PRE-OPERATIVE DIAGNOSIS:  1) 5112w4d pregnancy.  2)  Active Labor  POST-OPERATIVE DIAGNOSIS:  1) 6312w4d pregnancy s/p Vaginal, Spontaneous  2) Viable infant  Delivery Type: Vaginal, Spontaneous    Delivery Anesthesia: Epidural   Labor Complications:   None   ESTIMATED BLOOD LOSS: 50  ml    FINDINGS:   1) female infant   2) Nuchal cord: No  SPECIMENS:   PLACENTA:   Appearance: Intact    Removal: Spontaneous      Disposition:     DISPOSITION:  Infant to left in stable condition in the delivery room, with L&D personnel and mother,  NARRATIVE SUMMARY: Labor course:  Ms. Gabrielle GarterMercedes A Dante is a G2P1001 at 6612w4d who presented for labor management.  She progressed well in labor without pitocin.  She received the appropriate anesthesia and proceeded to complete dilation. She evidenced good maternal expulsive effort during the second stage. She went on to deliver a viable infant. The placenta delivered without problems and was noted to be complete. A perineal and vaginal examination was performed. Episiotomy/Lacerations: None  The patient tolerated this well.  Elonda Huskyavid J. Marcella Dunnaway, M.D. 10/20/2018 10:12 AM

## 2018-10-09 LAB — OB RESULTS CONSOLE GC/CHLAMYDIA
Chlamydia: NEGATIVE
Gonorrhea: NEGATIVE

## 2018-10-20 ENCOUNTER — Other Ambulatory Visit: Payer: Self-pay

## 2018-10-20 ENCOUNTER — Inpatient Hospital Stay: Payer: Medicaid Other | Admitting: Anesthesiology

## 2018-10-20 ENCOUNTER — Inpatient Hospital Stay
Admission: EM | Admit: 2018-10-20 | Discharge: 2018-10-21 | DRG: 807 | Disposition: A | Discharge status: Home / Self Care | Payer: Medicaid Other | Attending: Obstetrics and Gynecology | Admitting: Obstetrics and Gynecology

## 2018-10-20 ENCOUNTER — Encounter: Payer: Self-pay | Admitting: *Deleted

## 2018-10-20 DIAGNOSIS — Z3483 Encounter for supervision of other normal pregnancy, third trimester: Secondary | ICD-10-CM | POA: Diagnosis present

## 2018-10-20 DIAGNOSIS — Z3A38 38 weeks gestation of pregnancy: Secondary | ICD-10-CM

## 2018-10-20 DIAGNOSIS — Z3490 Encounter for supervision of normal pregnancy, unspecified, unspecified trimester: Secondary | ICD-10-CM

## 2018-10-20 LAB — CBC
HCT: 36.5 % (ref 36.0–46.0)
Hemoglobin: 12.4 g/dL (ref 12.0–15.0)
MCH: 31.4 pg (ref 26.0–34.0)
MCHC: 34 g/dL (ref 30.0–36.0)
MCV: 92.4 fL (ref 80.0–100.0)
Platelets: 216 10*3/uL (ref 150–400)
RBC: 3.95 MIL/uL (ref 3.87–5.11)
RDW: 12.8 % (ref 11.5–15.5)
WBC: 7.3 10*3/uL (ref 4.0–10.5)
nRBC: 0 % (ref 0.0–0.2)

## 2018-10-20 LAB — TYPE AND SCREEN
ABO/RH(D): A POS
Antibody Screen: NEGATIVE

## 2018-10-20 MED ORDER — IBUPROFEN 600 MG PO TABS
600.0000 mg | ORAL_TABLET | Freq: Four times a day (QID) | ORAL | Status: DC
  Administered 2018-10-20 – 2018-10-21 (×4): 600 mg via ORAL
  Filled 2018-10-20 (×4): qty 1

## 2018-10-20 MED ORDER — OXYTOCIN 10 UNIT/ML IJ SOLN
INTRAMUSCULAR | Status: AC
  Filled 2018-10-20: qty 2

## 2018-10-20 MED ORDER — DIPHENHYDRAMINE HCL 25 MG PO CAPS: 25.0000 mg | ORAL_CAPSULE | Freq: Four times a day (QID) | ORAL | Status: DC | PRN

## 2018-10-20 MED ORDER — SIMETHICONE 80 MG PO CHEW: 80.0000 mg | CHEWABLE_TABLET | ORAL | Status: DC | PRN

## 2018-10-20 MED ORDER — DOCUSATE SODIUM 100 MG PO CAPS
100.0000 mg | ORAL_CAPSULE | Freq: Two times a day (BID) | ORAL | Status: DC
  Administered 2018-10-20 – 2018-10-21 (×2): 100 mg via ORAL
  Filled 2018-10-20 (×2): qty 1

## 2018-10-20 MED ORDER — PHENYLEPHRINE 40 MCG/ML (10ML) SYRINGE FOR IV PUSH (FOR BLOOD PRESSURE SUPPORT)
80.0000 ug | PREFILLED_SYRINGE | INTRAVENOUS | Status: DC | PRN
  Filled 2018-10-20: qty 10

## 2018-10-20 MED ORDER — SODIUM CHLORIDE 0.9 % IV SOLN
INTRAVENOUS | Status: DC | PRN
  Administered 2018-10-20 (×2): 5 mL via EPIDURAL

## 2018-10-20 MED ORDER — LACTATED RINGERS IV SOLN: 500.0000 mL | INTRAVENOUS | Status: DC | PRN

## 2018-10-20 MED ORDER — OXYTOCIN 40 UNITS IN NORMAL SALINE INFUSION - SIMPLE MED
2.5000 [IU]/h | INTRAVENOUS | Status: DC
  Administered 2018-10-20: 2.5 [IU]/h via INTRAVENOUS

## 2018-10-20 MED ORDER — PRENATAL MULTIVITAMIN CH
1.0000 | ORAL_TABLET | Freq: Every day | ORAL | Status: DC
  Administered 2018-10-21: 1 via ORAL
  Filled 2018-10-20: qty 1

## 2018-10-20 MED ORDER — LACTATED RINGERS IV SOLN: 500.0000 mL | Freq: Once | INTRAVENOUS | Status: DC

## 2018-10-20 MED ORDER — OXYTOCIN 40 UNITS IN NORMAL SALINE INFUSION - SIMPLE MED
INTRAVENOUS | Status: AC
  Filled 2018-10-20: qty 1000

## 2018-10-20 MED ORDER — BENZOCAINE-MENTHOL 20-0.5 % EX AERO: 1.0000 "application " | INHALATION_SPRAY | CUTANEOUS | Status: DC | PRN

## 2018-10-20 MED ORDER — EPHEDRINE 5 MG/ML INJ
10.0000 mg | INTRAVENOUS | Status: DC | PRN
  Filled 2018-10-20: qty 2

## 2018-10-20 MED ORDER — ZOLPIDEM TARTRATE 5 MG PO TABS: 5.0000 mg | ORAL_TABLET | Freq: Every evening | ORAL | Status: DC | PRN

## 2018-10-20 MED ORDER — LIDOCAINE HCL (PF) 1 % IJ SOLN
INTRAMUSCULAR | Status: DC | PRN
  Administered 2018-10-20 (×3): 1 mL via INTRADERMAL

## 2018-10-20 MED ORDER — LIDOCAINE-EPINEPHRINE (PF) 1.5 %-1:200000 IJ SOLN
INTRAMUSCULAR | Status: DC | PRN
  Administered 2018-10-20: 3 mL via PERINEURAL

## 2018-10-20 MED ORDER — OXYCODONE-ACETAMINOPHEN 5-325 MG PO TABS: 1.0000 | ORAL_TABLET | ORAL | Status: DC | PRN

## 2018-10-20 MED ORDER — OXYTOCIN 40 UNITS IN NORMAL SALINE INFUSION - SIMPLE MED
2.5000 [IU]/h | INTRAVENOUS | Status: DC | PRN
  Filled 2018-10-20: qty 1000

## 2018-10-20 MED ORDER — FENTANYL 2.5 MCG/ML W/ROPIVACAINE 0.15% IN NS 100 ML EPIDURAL (ARMC)
12.0000 mL/h | EPIDURAL | Status: DC
  Administered 2018-10-20: 12 mL/h via EPIDURAL
  Filled 2018-10-20: qty 100

## 2018-10-20 MED ORDER — ACETAMINOPHEN 325 MG PO TABS
650.0000 mg | ORAL_TABLET | ORAL | Status: DC | PRN
  Administered 2018-10-20 (×2): 650 mg via ORAL
  Filled 2018-10-20 (×2): qty 2

## 2018-10-20 MED ORDER — AMMONIA AROMATIC IN INHA
RESPIRATORY_TRACT | Status: AC
  Filled 2018-10-20: qty 10

## 2018-10-20 MED ORDER — LACTATED RINGERS IV SOLN: INTRAVENOUS | Status: DC

## 2018-10-20 MED ORDER — OXYTOCIN BOLUS FROM INFUSION
500.0000 mL | Freq: Once | INTRAVENOUS | Status: AC
  Administered 2018-10-20: 500 mL via INTRAVENOUS

## 2018-10-20 MED ORDER — MISOPROSTOL 200 MCG PO TABS
ORAL_TABLET | ORAL | Status: AC
  Filled 2018-10-20: qty 4

## 2018-10-20 MED ORDER — DIPHENHYDRAMINE HCL 50 MG/ML IJ SOLN: 12.5000 mg | INTRAMUSCULAR | Status: DC | PRN

## 2018-10-20 MED ORDER — TETANUS-DIPHTH-ACELL PERTUSSIS 5-2.5-18.5 LF-MCG/0.5 IM SUSP: 0.5000 mL | Freq: Once | INTRAMUSCULAR | Status: DC

## 2018-10-20 MED ORDER — LIDOCAINE HCL (PF) 1 % IJ SOLN
INTRAMUSCULAR | Status: AC
  Filled 2018-10-20: qty 30

## 2018-10-20 NOTE — Lactation Note (Signed)
This note was copied from a baby's chart. Lactation Consultation Note  Patient Name: Gabrielle Richrd PrimeMercedes Cassity BJYNW'GToday's Date: 10/20/2018 Reason for consult: Initial assessment   Maternal Data Has patient been taught Hand Expression?: Yes Does the patient have breastfeeding experience prior to this delivery?: No  Feeding Feeding Type: Breast Fed  LATCH Score Latch: Repeated attempts needed to sustain latch, nipple held in mouth throughout feeding, stimulation needed to elicit sucking reflex.  Audible Swallowing: A few with stimulation  Type of Nipple: Everted at rest and after stimulation  Comfort (Breast/Nipple): Soft / non-tender  Hold (Positioning): Assistance needed to correctly position infant at breast and maintain latch.  LATCH Score: 7  Interventions Interventions: Assisted with latch;Hand express;Support pillows;Position options  Lactation Tools Discussed/Used     Consult Status Consult Status: Follow-up Date: 10/20/18 Follow-up type: In-patient  Mother states that infant breast-fed well during the first breastfeeding after the birth and now infant is rooting but mom cannot get infant latched. Infant was placed in the cradle position and mom was taught how to hand express colostrum and sandwich her breast for a deep latch. LC reviewed signs of a good and bad latch and what to expect for the first 24 hours, frequency of wet and dirty diapers and cluster-feeding on day two.  Gabrielle Howell 10/20/2018, 1:20 PM

## 2018-10-20 NOTE — OB Triage Note (Signed)
Recvd pt from ED. Pt c/o contractions that started around 0340 and are 3 min apart. No vaginal bleeding or LOF. Has noticed decreased fetal movement since contractions started.

## 2018-10-20 NOTE — Anesthesia Procedure Notes (Addendum)
Epidural Patient location during procedure: OB Start time: 10/20/2018 6:09 AM End time: 10/20/2018 6:29 AM  Staffing Anesthesiologist: Teagen Mcleary, Cleda Mccreedy, MD Performed: anesthesiologist   Preanesthetic Checklist Completed: patient identified, site marked, surgical consent, pre-op evaluation, timeout performed, IV checked, risks and benefits discussed and monitors and equipment checked  Epidural Patient position: sitting Prep: ChloraPrep Patient monitoring: heart rate, continuous pulse ox and blood pressure Approach: midline Location: L4-L5 Injection technique: LOR saline  Needle:  Needle type: Tuohy  Needle gauge: 17 G Needle length: 9 cm and 9 Needle insertion depth: 10 cm Catheter type: closed end flexible Catheter size: 19 Gauge Catheter at skin depth: 16 cm Test dose: negative and 1.5% lidocaine with Epi 1:200 K  Assessment Sensory level: T10 Events: blood not aspirated, injection not painful, no injection resistance, negative IV test and no paresthesia  Additional Notes 3 attempts Pt. Evaluated and documentation done after procedure finished. Patient identified. Risks/Benefits/Options discussed with patient including but not limited to bleeding, infection, nerve damage, paralysis, failed block, incomplete pain control, headache, blood pressure changes, nausea, vomiting, reactions to medication both or allergic, itching and postpartum back pain. Confirmed with bedside nurse the patient's most recent platelet count. Confirmed with patient that they are not currently taking any anticoagulation, have any bleeding history or any family history of bleeding disorders. Patient expressed understanding and wished to proceed. All questions were answered. Sterile technique was used throughout the entire procedure. Please see nursing notes for vital signs. Test dose was given through epidural catheter and negative prior to continuing to dose epidural or start infusion. Warning signs of  high block given to the patient including shortness of breath, tingling/numbness in hands, complete motor block, or any concerning symptoms with instructions to call for help. Patient was given instructions on fall risk and not to get out of bed. All questions and concerns addressed with instructions to call with any issues or inadequate analgesia.   Patient tolerated the insertion well without immediate complications.Reason for block:procedure for pain

## 2018-10-20 NOTE — Anesthesia Preprocedure Evaluation (Signed)
Anesthesia Evaluation  Patient identified by MRN, date of birth, ID band Patient awake    Reviewed: Allergy & Precautions, H&P , NPO status , Patient's Chart, lab work & pertinent test results  Airway Mallampati: III  TM Distance: >3 FB Neck ROM: full    Dental  (+) Chipped   Pulmonary neg pulmonary ROS,           Cardiovascular Exercise Tolerance: Good (-) hypertensionnegative cardio ROS       Neuro/Psych    GI/Hepatic negative GI ROS,   Endo/Other    Renal/GU   negative genitourinary   Musculoskeletal   Abdominal   Peds  Hematology negative hematology ROS (+)   Anesthesia Other Findings History reviewed. No pertinent past medical history.  Past Surgical History: No date: NO PAST SURGERIES  BMI    Body Mass Index:  38.27 kg/m      Reproductive/Obstetrics (+) Pregnancy                             Anesthesia Physical Anesthesia Plan  ASA: II  Anesthesia Plan: Epidural   Post-op Pain Management:    Induction:   PONV Risk Score and Plan:   Airway Management Planned:   Additional Equipment:   Intra-op Plan:   Post-operative Plan:   Informed Consent: I have reviewed the patients History and Physical, chart, labs and discussed the procedure including the risks, benefits and alternatives for the proposed anesthesia with the patient or authorized representative who has indicated his/her understanding and acceptance.       Plan Discussed with: Anesthesiologist  Anesthesia Plan Comments:         Anesthesia Quick Evaluation

## 2018-10-20 NOTE — H&P (Signed)
History and Physical   HPI  Gabrielle Howell is a 23 y.o. G2P1001 at 2375w4d Estimated Date of Delivery: 10/30/18 who is being admitted for  labor management.  Prenatal care was uncomplicated at the [facility].   OB History  OB History  Gravida Para Term Preterm AB Living  2 1 1  0 0 1  SAB TAB Ectopic Multiple Live Births  0 0 0 0 0    # Outcome Date GA Lbr Len/2nd Weight Sex Delivery Anes PTL Lv  2 Current           1 Term 06/10/15 2882w3d 13:58 / 01:10 3400 g M Vag-Spont EPI       Name: Monreal,BOY Lilyanah     Apgar1: 8  Apgar5: 9    PROBLEM LIST  Pregnancy complications or risks: Patient Active Problem List   Diagnosis Date Noted  . Pregnancy 10/20/2018  . Vaginal itching 09/01/2018  . Labor and delivery indication for care or intervention 06/09/2015  . Indication for care or intervention related to labor and delivery 06/09/2015     Prenatal labs and studies: ABO, Rh:   Antibody:   Rubella:   RPR:    HBsAg:    HIV:    GBS:    History reviewed. No pertinent past medical history.   Past Surgical History:  Procedure Laterality Date  . NO PAST SURGERIES       Medications    Current Discharge Medication List    CONTINUE these medications which have NOT CHANGED   Details  Prenatal Vit-Fe Fumarate-FA (PRENATAL MULTIVITAMIN) TABS tablet Take 1 tablet by mouth daily at 12 noon.    acetaminophen (TYLENOL) 500 MG tablet Take 2 tablets (1,000 mg total) by mouth every 6 (six) hours as needed for moderate pain. Qty: 30 tablet, Refills: 0    fluconazole (DIFLUCAN) 150 MG tablet Take 1 tablet (150 mg total) by mouth every 3 (three) days. For 2 doses, if symptoms persist Qty: 2 tablet, Refills: 0    nitrofurantoin, macrocrystal-monohydrate, (MACROBID) 100 MG capsule Take 1 capsule (100 mg total) by mouth 2 (two) times daily. Next dose due at 10 pm Qty: 13 capsule, Refills: 0         Allergies  Patient has no known allergies.  Review of  Systems  Pertinent items are noted in HPI.  Physical Exam  BP 115/67   Pulse 95   Temp 98 F (36.7 C) (Oral)   Resp 18   Ht 5\' 5"  (1.651 m)   Wt 104.3 kg   LMP 01/16/2018   BMI 38.27 kg/m   Lungs:  CTA B Cardio: RRR without M/R/G Abd: Soft, gravid, NT Presentation: cephalic EXT: No C/C/ 1+ Edema DTRs: 2+ B CERVIX: Dilation: 8 Effacement (%): 100 Station: -1 Presentation: Vertex Exam by:: MBS  See Prenatal records for more detailed PE.    FHR:  Variability: Good {> 6 bpm)  Toco: Uterine Contractions: Regular q3 min   Test Results  Results for orders placed or performed during the hospital encounter of 10/20/18 (from the past 24 hour(s))  CBC     Status: None   Collection Time: 10/20/18  5:30 AM  Result Value Ref Range   WBC 7.3 4.0 - 10.5 K/uL   RBC 3.95 3.87 - 5.11 MIL/uL   Hemoglobin 12.4 12.0 - 15.0 g/dL   HCT 16.136.5 09.636.0 - 04.546.0 %   MCV 92.4 80.0 - 100.0 fL   MCH 31.4 26.0 - 34.0 pg  MCHC 34.0 30.0 - 36.0 g/dL   RDW 16.112.8 09.611.5 - 04.515.5 %   Platelets 216 150 - 400 K/uL   nRBC 0.0 0.0 - 0.2 %   Group B Strep negative  Assessment   G2P1001 at 5836w4d Estimated Date of Delivery: 10/30/18  The fetus is reassuring.    Patient Active Problem List   Diagnosis Date Noted  . Pregnancy 10/20/2018  . Vaginal itching 09/01/2018  . Labor and delivery indication for care or intervention 06/09/2015  . Indication for care or intervention related to labor and delivery 06/09/2015    Plan  1. Admit to L&D :    2. EFM: -- Category 1 3. Epidural if desired.  Stadol for IV pain until epidural requested. 4. Admission labs  5. Expect vaginal delivery  Elonda Huskyavid J. Libby Goehring, M.D. 10/20/2018 6:12 AM

## 2018-10-21 LAB — RPR: RPR Ser Ql: NONREACTIVE

## 2018-10-21 NOTE — Progress Notes (Signed)
Discharge instructions provided.   Pt and sig other verbalize understanding of all instructions and follow-up care.  Pt discharged to home with infant at 1440 on 10/21/18 via wheelchair by RN. Reynold Bowen, RN 10/21/2018 5:43 PM

## 2018-10-21 NOTE — Lactation Note (Signed)
This note was copied from a baby's chart. Lactation Consultation Note  Patient Name: Gabrielle Richrd PrimeMercedes Reza GEXBM'WToday's Date: 10/21/2018     Maternal Data    Feeding    LATCH Score                   Interventions    Lactation Tools Discussed/Used     Consult Status  LC spoke with mother about any concerns with breastfeeding before discharge. LC educated mother on what to expect with clusterfeeding. Mother states that she wants to both breast and bottle feed. LC reviewed frequency of breastfeeding and offering breast first before the bottle. Mother is connected with WIC and was told about lactation outpatient clinic and [facility]    Gabrielle Howell 10/21/2018, 3:24 PM

## 2018-10-21 NOTE — Anesthesia Postprocedure Evaluation (Signed)
Anesthesia Post Note  Patient: Gabrielle Howell  Procedure(s) Performed: AN AD HOC LABOR EPIDURAL  Patient location during evaluation: Mother Baby Anesthesia Type: Epidural Level of consciousness: awake and alert and oriented Pain management: pain level controlled Vital Signs Assessment: post-procedure vital signs reviewed and stable Respiratory status: spontaneous breathing Cardiovascular status: stable Postop Assessment: patient able to bend at knees, adequate PO intake, no apparent nausea or vomiting and able to ambulate Anesthetic complications: no     Last Vitals:  Vitals:   10/21/18 0405 10/21/18 0826  BP: 113/72 125/72  Pulse: 70 74  Resp: 20 18  Temp: 36.6 C 36.7 C  SpO2:  98%    Last Pain:  Vitals:   10/21/18 0826  TempSrc: Oral  PainSc:                  Zachary George

## 2018-10-21 NOTE — Discharge Summary (Signed)
                               Discharge Summary  Date of Admission: 10/20/2018  Date of Discharge: 10/21/2018  Admitting Diagnosis: Onset of Labor at [redacted]w[redacted]d  Mode of Delivery: normal spontaneous vaginal delivery                 Discharge Diagnosis: No other diagnosis   Intrapartum Procedures: Atificial rupture of membranes and epidural   Post partum procedures:   Complications: none                      Discharge Day SOAP Note:  Progress Note - Vaginal Delivery  Gabrielle Howell is a 23 y.o. G2P2002 now PP day 1 s/p Vaginal, Spontaneous . Delivery was uncomplicated  Subjective  The patient has the following complaints: has no unusual complaints  Pain is controlled with current medications.   Patient is urinating without difficulty.  She is ambulating well.    Objective  Vital signs: BP 125/72 (BP Location: Right Arm)   Pulse 74   Temp 98 F (36.7 C) (Oral)   Resp 18   Ht 5\' 5"  (1.651 m)   Wt 104.3 kg   LMP 01/16/2018   SpO2 98%   Breastfeeding Unknown   BMI 38.27 kg/m   Physical Exam: Gen: NAD Fundus Fundal Tone: Firm  Lochia Amount: Small  Perineum Appearance: Intact     Data Review Labs: CBC Latest Ref Rng & Units 10/20/2018 02/02/2018 06/10/2015  WBC 4.0 - 10.5 K/uL 7.3 3.7 15.5(H)  Hemoglobin 12.0 - 15.0 g/dL 62.6 94.8 10.7(L)  Hematocrit 36.0 - 46.0 % 36.5 39.6 31.8(L)  Platelets 150 - 400 K/uL 216 240 188   A POS  Assessment/Plan  Active Problems:   Pregnancy    Plan for discharge today.   Discharge Instructions: Per After Visit Summary. Activity: Advance as tolerated. Pelvic rest for 6 weeks.  Also refer to After Visit Summary Diet: Regular Medications: Allergies as of 10/21/2018   No Known Allergies     Medication List    STOP taking these medications   acetaminophen 500 MG tablet Commonly known as:  TYLENOL   fluconazole 150 MG tablet Commonly known as:  DIFLUCAN   nitrofurantoin (macrocrystal-monohydrate) 100 MG  capsule Commonly known as:  MACROBID     TAKE these medications   prenatal multivitamin Tabs tablet Take 1 tablet by mouth daily at 12 noon.      Outpatient follow up:  Follow-up Information    Linzie Collin, MD Follow up in 6 week(s).   Specialties:  Obstetrics and Gynecology, Radiology Contact information: [address] [place] [address] [phone]          Postpartum contraception: Will discuss at first office visit post-partum  Discharged Condition: good  Discharged to: home  Newborn Data: Disposition:home with mother  Apgars: APGAR (1 MIN): 8   APGAR (5 MINS): 9   APGAR (10 MINS):    Baby Feeding: Bottle and Breast    Elonda Husky, M.D. 10/21/2018 8:43 AM

## 2018-10-21 NOTE — Progress Notes (Signed)
Education provided on need for Influenza vaccine.  Pt declines vaccine at this time.  Pt states that she received TDaP vaccine during pregnancy. Reynold Bowen, RN 10/21/2018 1:15 PM

## 2018-10-21 NOTE — Discharge Instructions (Signed)
Call your doctor for increased pain or vaginal bleeding, temperature above 100.4, depression, or concerns.  Increase calories and fluids while breastfeeding.  Continue prenatal vitamin and iron.  No strenuous activity or heavy lifting for 6 weeks. No intercourse, tampons, or douching for 6 weeks.  No tub baths- showers only.  No driving for 2 weeks or while taking pain medications.   °

## 2018-11-11 IMAGING — US US PELVIS COMPLETE TRANSABD/TRANSVAG
1 series · 14 of 25 positions shown · non-contrast
Comparison: None

CLINICAL DATA: Pelvic pain.  21-year-old female.



[Series 1: us pelvis complete transabd/transvag · 0.20mm/px · 14 of 94 slices shown]
[im 1/94]
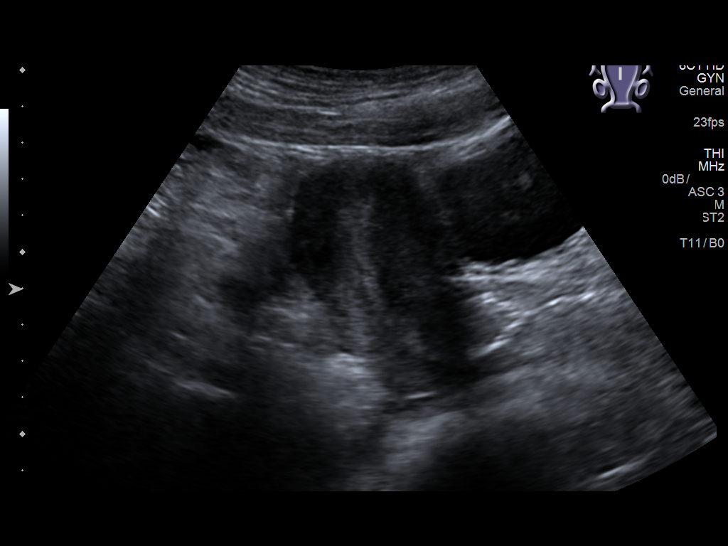
[im 8/94]
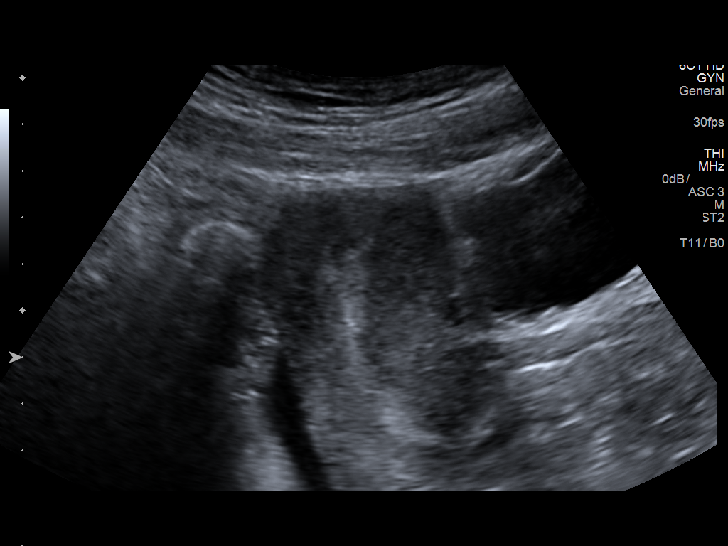
[im 16/94]
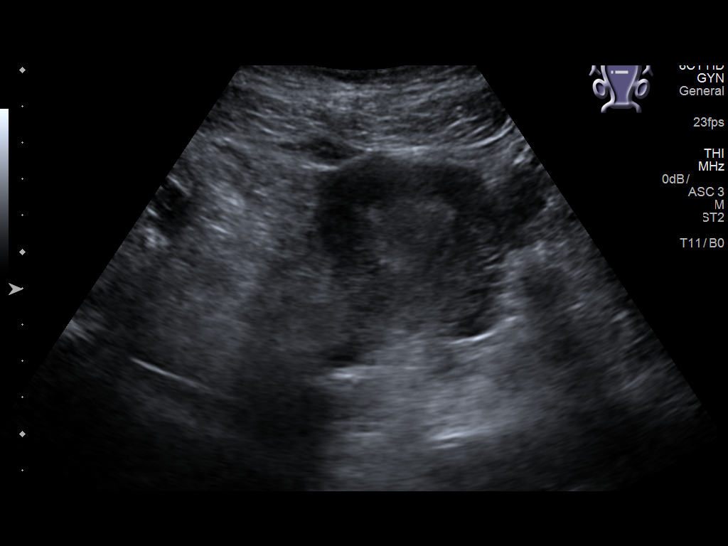
[im 24/94]
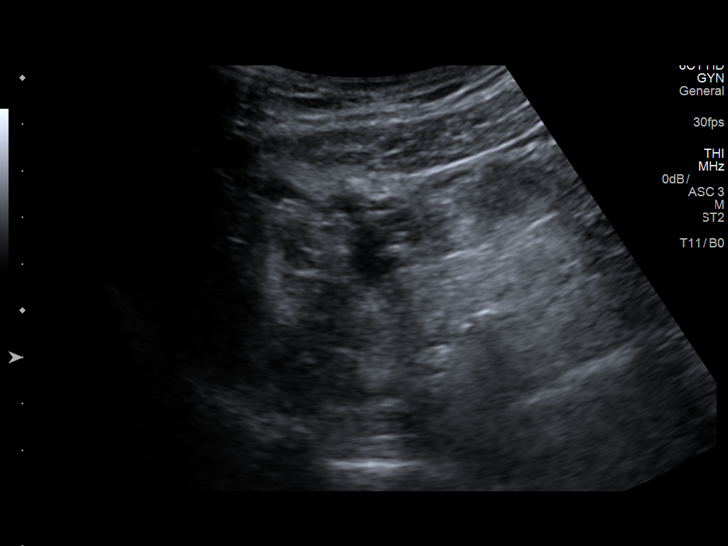
[im 32/94]
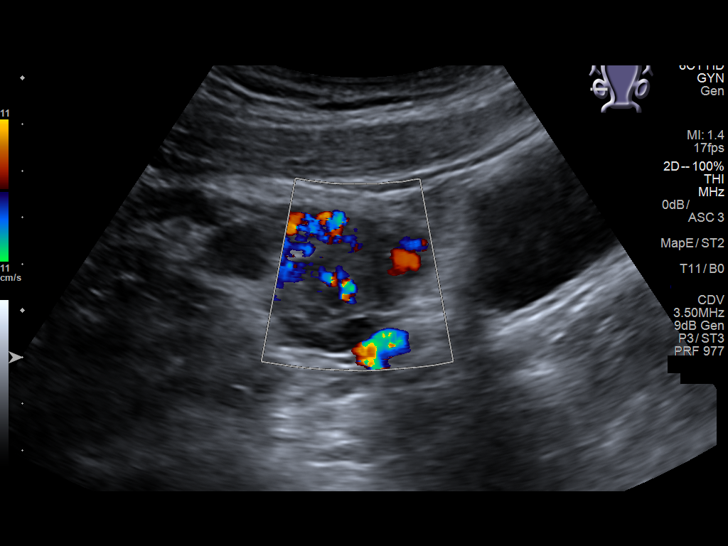
[im 35/94]
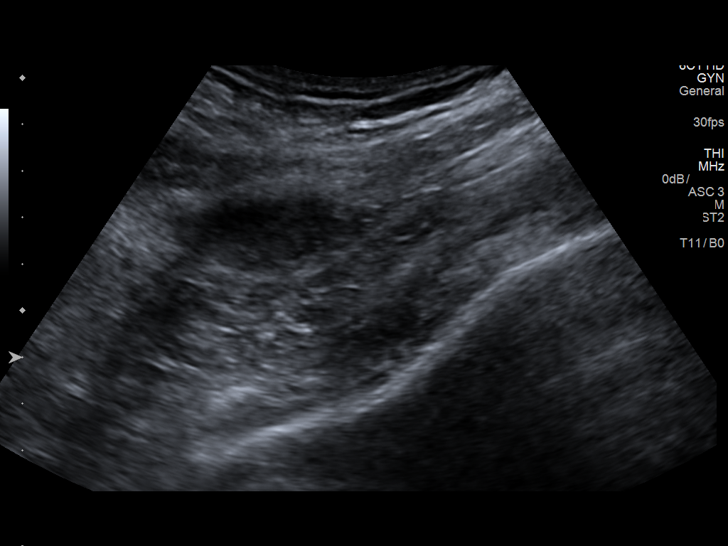
[im 43/94]
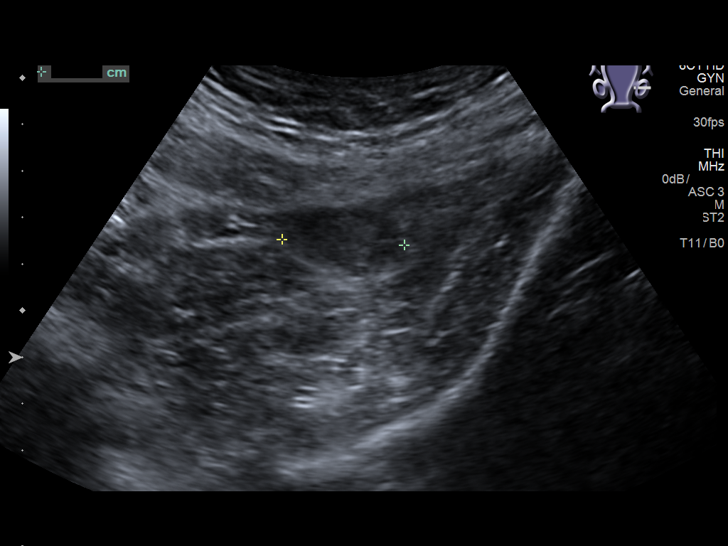
[im 51/94]
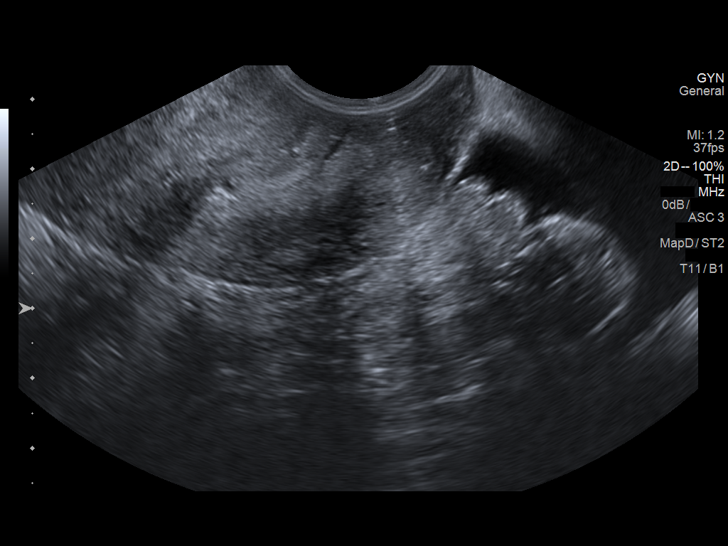
[im 59/94]
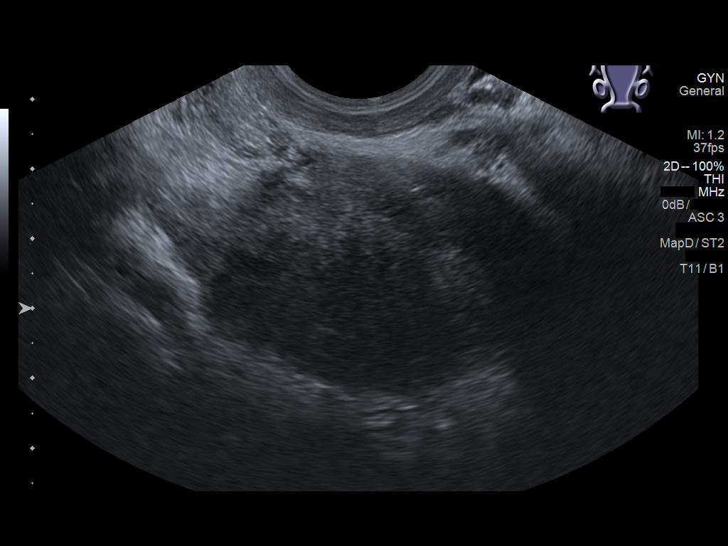
[im 63/94]
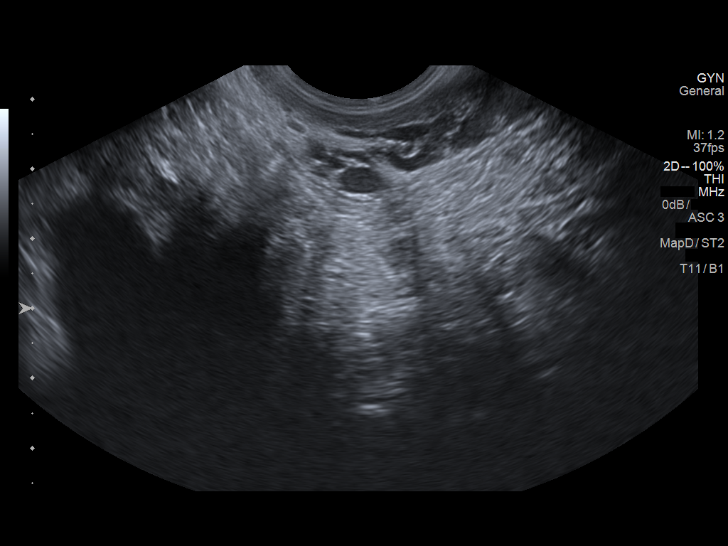
[im 70/94]
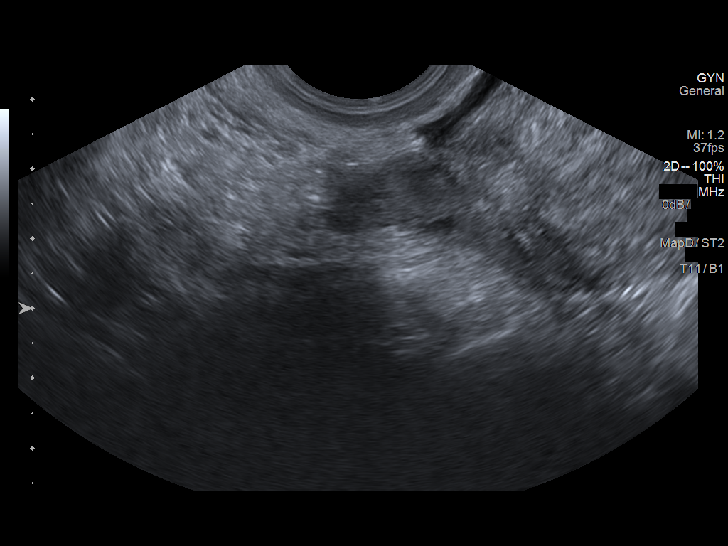
[im 78/94]
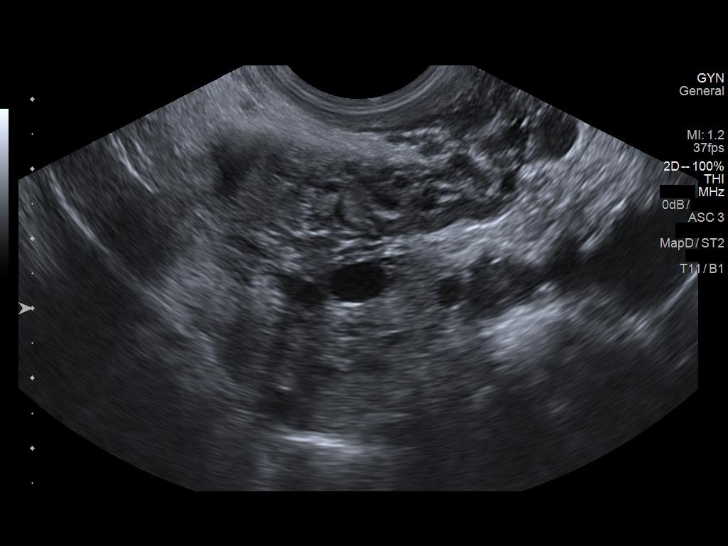
[im 86/94]
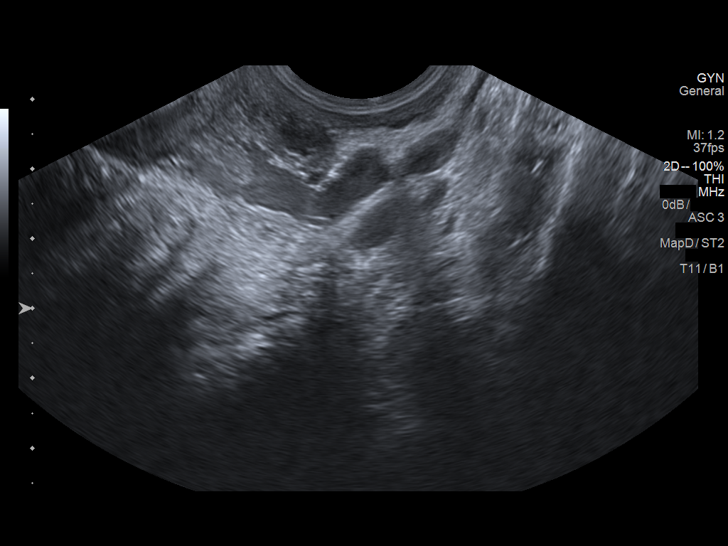
[im 94/94]
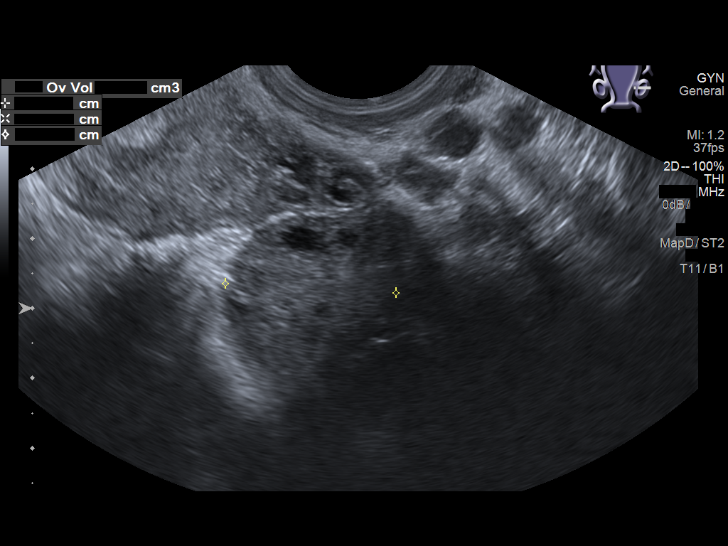

[14 of 25 positions shown; findings below may reference images not displayed]

FINDINGS: Uterus

Measurements: Normal in size at 6.9 x 4.2 x 5.1 cm.. No fibroids or
other mass visualized.

Endometrium

Thickness: Normal in thickness for premenopausal female at 10 mm..

Right ovary

Measurements: Normal in size at 2.8 x 1.81.7 cm. Normal color
Doppler with solitary follicle period.

Left ovary

Measurements: Normal size at 3.2 x 2.1 x 2.5 cm. Normal color
Doppler flow. Small follicles.

Other findings

Prominent veins in the LEFT adnexa.
IMPRESSION: Normal uterus and ovaries.

## 2018-12-01 DIAGNOSIS — E669 Obesity, unspecified: Secondary | ICD-10-CM | POA: Insufficient documentation

## 2018-12-01 LAB — HM PAP SMEAR: HM Pap smear: NEGATIVE

## 2019-01-25 IMAGING — US US MFM OB COMPLETE +14 WKS
1 series · 14 of 28 positions shown · non-contrast
Comparison: none

PATIENT INFO:

PERFORMED BY:
YENNY
SERVICE(S) PROVIDED:
INDICATIONS:
12 weeks gestation of pregnancy
FETAL EVALUATION:
Num Of Fetuses:     1
Fetal Heart         165
Rate(bpm):
Cardiac Activity:   Present
Presentation:       Variable
Placenta:           Anterior , fundal
BIOMETRY:
CRL:      55.5  mm     G. Age:  12w 1d                  EDD:   10/30/18
GESTATIONAL AGE:
LMP:           13w 0d        Date:  01/17/18                 EDD:   10/24/18
Best:          12w 1d     Det. By:  U/S C R L (04/18/18)     EDD:   10/30/18

[Series 1: us mfm ob complete +14 wks · 0.22mm/px · 14 of 29 slices shown]
[im 2/29]
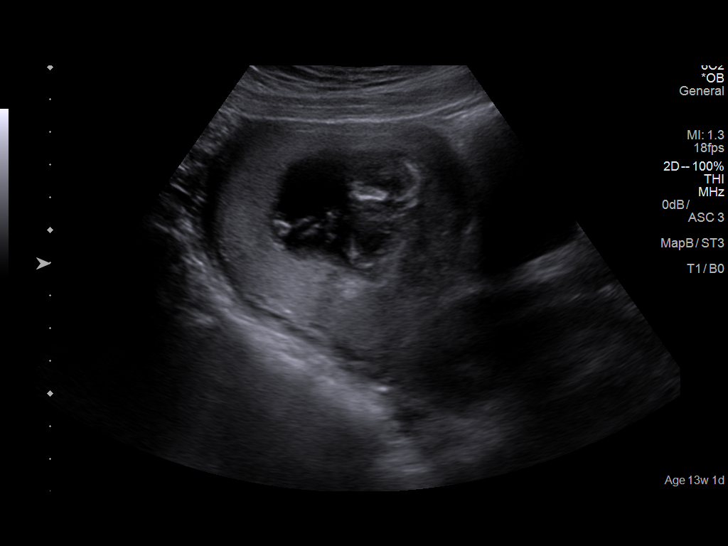
[im 4/29]
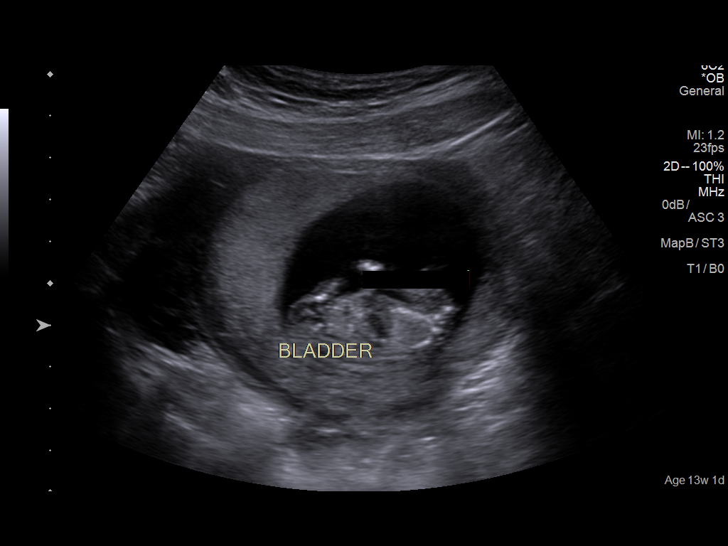
[im 6/29]
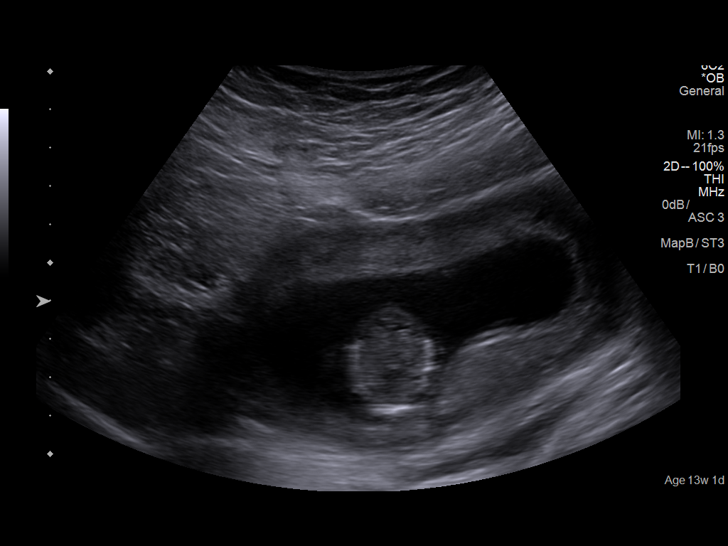
[im 8/29]
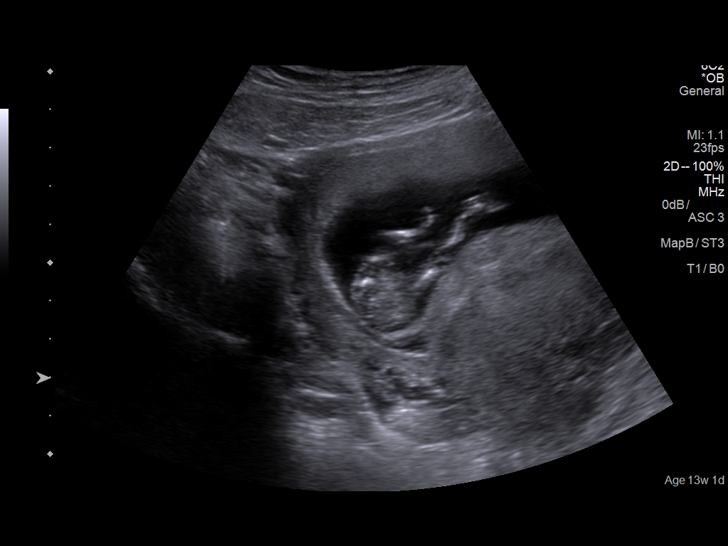
[im 10/29]
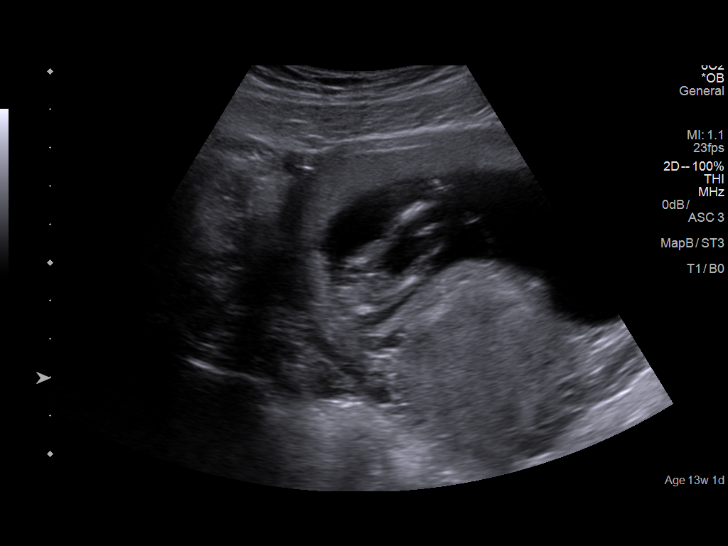
[im 12/29]
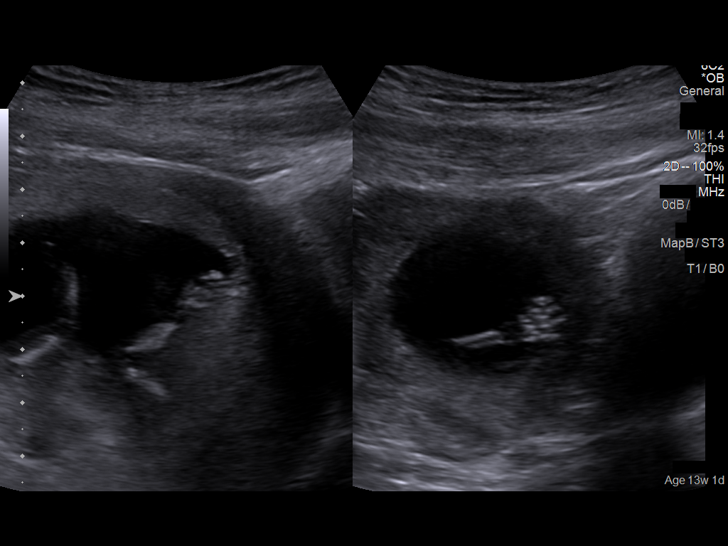
[im 14/29]
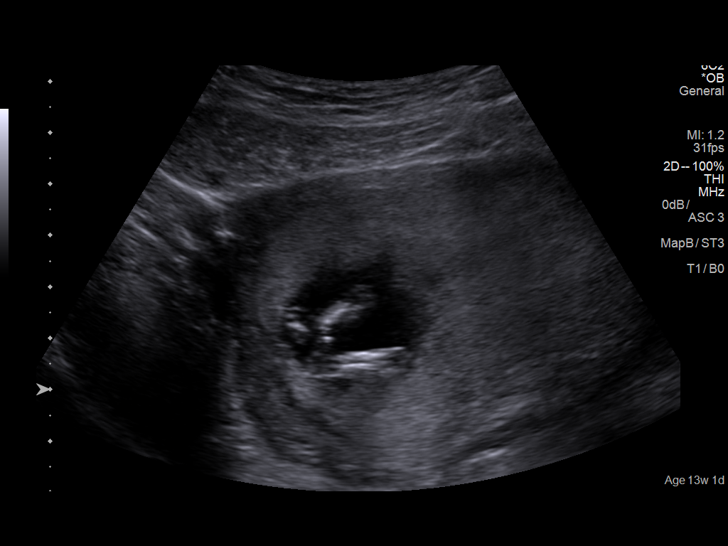
[im 16/29]
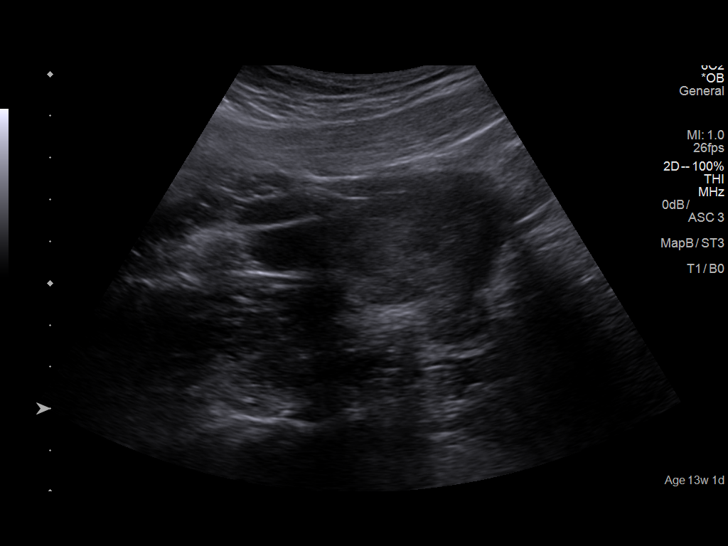
[im 18/29]
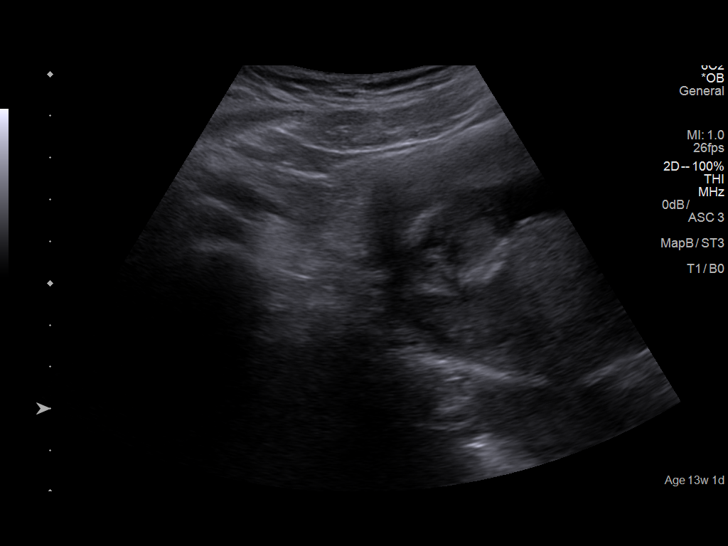
[im 20/29]
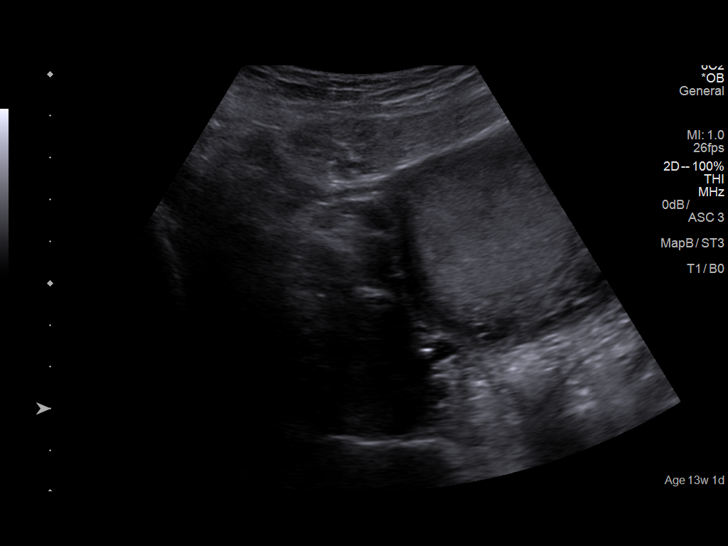
[im 22/29]
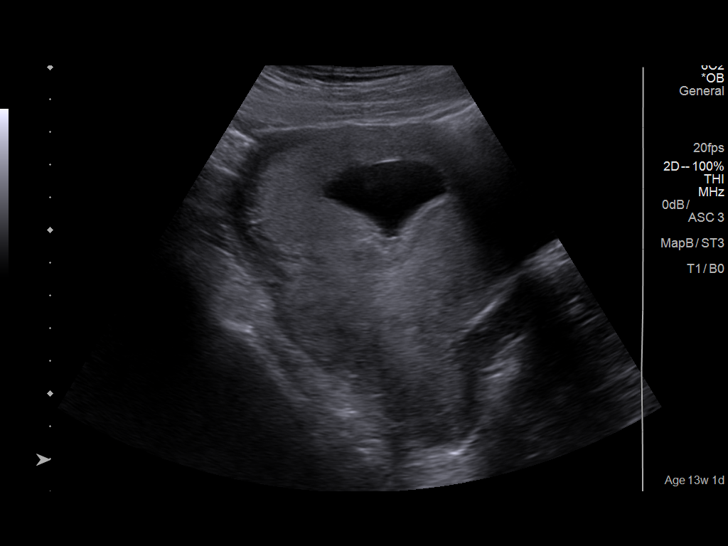
[im 24/29]
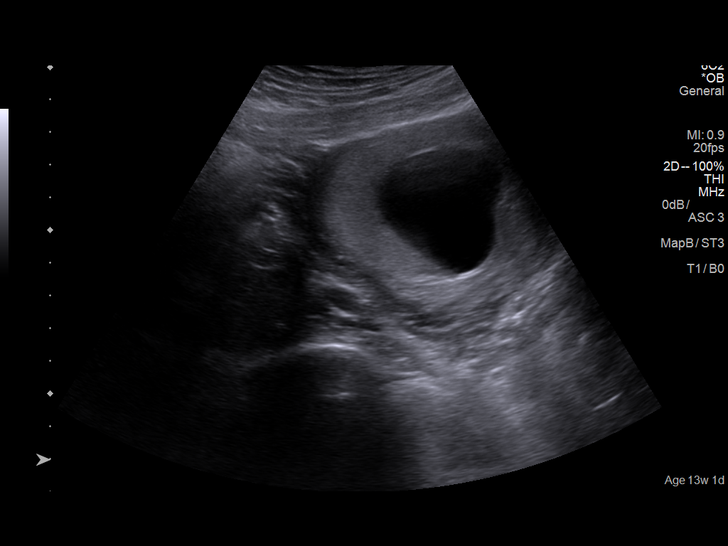
[im 26/29]
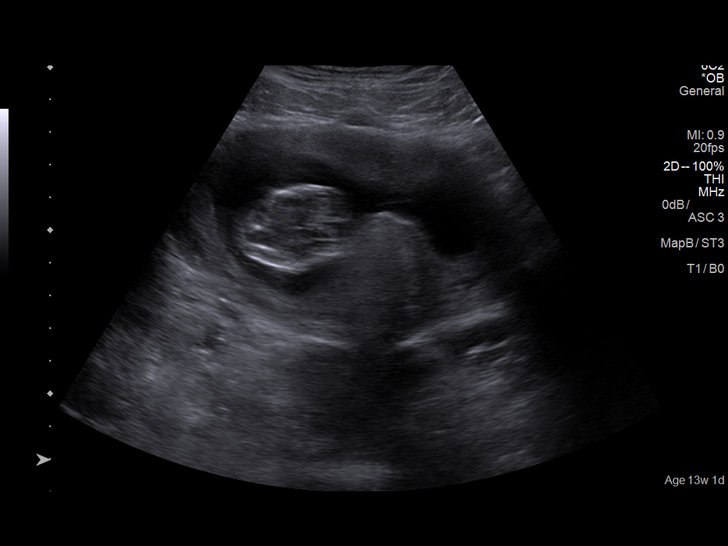
[im 29/29]
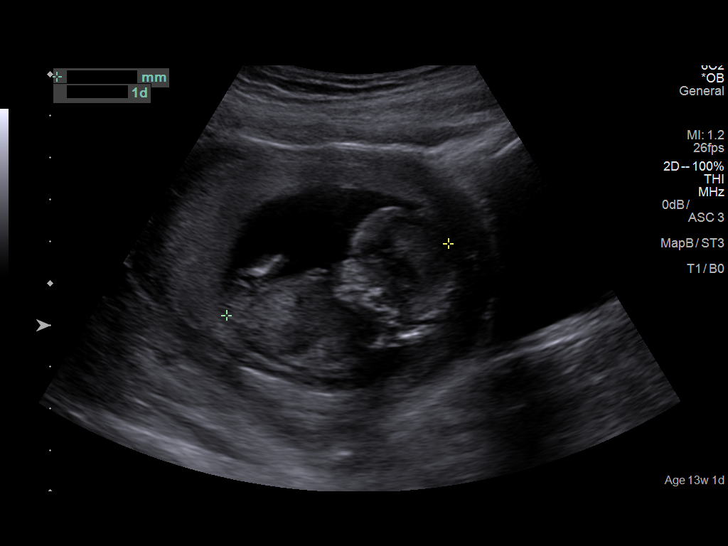

[14 of 28 positions shown; findings below may reference images not displayed]

IMPRESSION: Thank you for referring your patient for first trimester
screening. She had the opportunity to meet with our genetic
counselor today but declined genetic counseling or
screening.

Ultrasound demonstrates a single live pregnancy at 12 weeks
1 day.   Dating is by today's ultrasound.

Fetal limbs, stomach, bladder, and symmetric choroid plexus
were seen today. The gestational age is too early for a
complete anatomic survey.

Maternal ovaries appear normal.
Findings were reviewed.
Thank you for allowing us to participate in your patient's care.

## 2019-06-13 ENCOUNTER — Emergency Department
Admission: EM | Admit: 2019-06-13 | Discharge: 2019-06-13 | Disposition: A | Discharge status: Home / Self Care | Payer: Medicaid Other | Attending: Emergency Medicine | Admitting: Emergency Medicine

## 2019-06-13 ENCOUNTER — Encounter: Payer: Self-pay | Admitting: Medical Oncology

## 2019-06-13 ENCOUNTER — Other Ambulatory Visit: Payer: Self-pay

## 2019-06-13 DIAGNOSIS — J029 Acute pharyngitis, unspecified: Secondary | ICD-10-CM | POA: Diagnosis not present

## 2019-06-13 DIAGNOSIS — R070 Pain in throat: Secondary | ICD-10-CM | POA: Diagnosis present

## 2019-06-13 LAB — GROUP A STREP BY PCR: Group A Strep by PCR: NOT DETECTED

## 2019-06-13 MED ORDER — LIDOCAINE VISCOUS HCL 2 % MT SOLN: 5.0000 mL | Freq: Four times a day (QID) | OROMUCOSAL | 0 refills | Status: DC | PRN

## 2019-06-13 MED ORDER — LIDOCAINE VISCOUS HCL 2 % MT SOLN
15.0000 mL | Freq: Once | OROMUCOSAL | Status: AC
  Administered 2019-06-13: 15 mL via OROMUCOSAL
  Filled 2019-06-13: qty 15

## 2019-06-13 NOTE — ED Triage Notes (Signed)
Pt reports sore throat since yesterday- denies other sx's. Pt denies any known exposure to COVID

## 2019-06-13 NOTE — ED Provider Notes (Signed)
[facility] Emergency Department Provider Note   ____________________________________________   First MD Initiated Contact with Patient 06/13/19 1154     (approximate)  I have reviewed the triage vital signs and the nursing notes.   HISTORY  Chief Complaint Sore Throat    HPI Gabrielle Howell is a 23 y.o. female patient presents with 2 days of sore throat.  Patient denies URI signs symptoms.  Patient denies any recent travel or exposure to COVID-19.  Patient states she can tolerate food and fluids but has decreased appetite secondary to pain.  Patient rates the pain as a 6/10.  Patient described the pain is "sore".  No palliative measure for complaint.         History reviewed. No pertinent past medical history.  Patient Active Problem List   Diagnosis Date Noted  . Pregnancy 10/20/2018  . Vaginal itching 09/01/2018  . Labor and delivery indication for care or intervention 06/09/2015  . Indication for care or intervention related to labor and delivery 06/09/2015    Past Surgical History:  Procedure Laterality Date  . NO PAST SURGERIES      Prior to Admission medications   Medication Sig Start Date End Date Taking? Authorizing Provider  lidocaine (XYLOCAINE) 2 % solution Use as directed 5 mLs in the mouth or throat every 6 (six) hours as needed for mouth pain. For oral swish and swallow. 06/13/19   Joni ReiningSmith, Hyrum Shaneyfelt K, PA-C    Allergies Patient has no known allergies.  No family history on file.  Social History Social History   Tobacco Use  . Smoking status: Never Smoker  . Smokeless tobacco: Never Used  Substance Use Topics  . Alcohol use: No  . Drug use: Not Currently    Types: Marijuana    Review of Systems Constitutional: No fever/chills Eyes: No visual changes. ENT: Sore throat.   Cardiovascular: Denies chest pain. Respiratory: Denies shortness of breath. Gastrointestinal: No abdominal pain.  No nausea, no vomiting.  No  diarrhea.  No constipation. Genitourinary: Negative for dysuria. Musculoskeletal: Negative for back pain. Skin: Negative for rash. Neurological: Negative for headaches, focal weakness or numbness.   ____________________________________________   PHYSICAL EXAM:  VITAL SIGNS: ED Triage Vitals  Enc Vitals Group     BP 06/13/19 1125 119/73     Pulse Rate 06/13/19 1125 72     Resp 06/13/19 1125 18     Temp 06/13/19 1125 98.5 F (36.9 C)     Temp Source 06/13/19 1125 Oral     SpO2 06/13/19 1125 100 %     Weight 06/13/19 1126 210 lb (95.3 kg)     Height 06/13/19 1126 5\' 7"  (1.702 m)     Head Circumference --      Peak Flow --      Pain Score 06/13/19 1126 6     Pain Loc --      Pain Edu? --      Excl. in GC? --    Constitutional: Alert and oriented. Well appearing and in no acute distress. Nose: No congestion/rhinnorhea. Mouth/Throat: Mucous membranes are moist.  Oropharynx non-erythematous. Neck: No stridor.  Hematological/Lymphatic/Immunilogical: No cervical lymphadenopathy. Cardiovascular: Normal rate, regular rhythm. Grossly normal heart sounds.  Good peripheral circulation. Respiratory: Normal respiratory effort.  No retractions. Lungs CTAB. Skin:  Skin is warm, dry and intact. No rash noted. Psychiatric: Mood and affect are normal. Speech and behavior are normal.  ____________________________________________   LABS (all labs ordered are listed, but  only abnormal results are displayed)  Labs Reviewed  GROUP A STREP BY PCR   ____________________________________________  EKG   ____________________________________________  RADIOLOGY  ED MD interpretation:    Official radiology report(s): No results found.  ____________________________________________   PROCEDURES  Procedure(s) performed (including Critical Care):  Procedures   ____________________________________________   INITIAL IMPRESSION / ASSESSMENT AND PLAN / ED COURSE  As part of my  medical decision making, I reviewed the following data within the [facility] was evaluated in Emergency Department on 06/13/2019 for the symptoms described in the history of present illness. She was evaluated in the context of the global COVID-19 pandemic, which necessitated consideration that the patient might be at risk for infection with the SARS-CoV-2 virus that causes COVID-19. Institutional protocols and algorithms that pertain to the evaluation of patients at risk for COVID-19 are in a state of rapid change based on information released by regulatory bodies including the CDC and federal and state organizations. These policies and algorithms were followed during the patient's care in the ED.  Patient presents with 2 days of sore throat.  Physical exam is grossly unremarkable.  Discussed negative rapid strep test with patient.  Patient given discharge care instructions and advised [facility].      ____________________________________________   FINAL CLINICAL IMPRESSION(S) / ED DIAGNOSES  Final diagnoses:  Viral pharyngitis     ED Discharge Orders         Ordered    lidocaine (XYLOCAINE) 2 % solution  Every 6 hours PRN     06/13/19 1313           Note:  This document was prepared using Dragon voice recognition software and may include unintentional dictation errors.    Sable Feil, PA-C 06/13/19 1316    Earleen Newport, MD 06/13/19 [phone]

## 2019-08-02 ENCOUNTER — Emergency Department
Admission: EM | Admit: 2019-08-02 | Discharge: 2019-08-02 | Disposition: A | Discharge status: Home / Self Care | Payer: Medicaid Other | Attending: Emergency Medicine | Admitting: Emergency Medicine

## 2019-08-02 ENCOUNTER — Other Ambulatory Visit: Payer: Self-pay

## 2019-08-02 DIAGNOSIS — H1032 Unspecified acute conjunctivitis, left eye: Secondary | ICD-10-CM | POA: Diagnosis not present

## 2019-08-02 DIAGNOSIS — H5789 Other specified disorders of eye and adnexa: Secondary | ICD-10-CM | POA: Diagnosis present

## 2019-08-02 MED ORDER — FLUORESCEIN SODIUM 1 MG OP STRP
1.0000 | ORAL_STRIP | Freq: Once | OPHTHALMIC | Status: DC
  Filled 2019-08-02: qty 1

## 2019-08-02 MED ORDER — EYE WASH OPHTH SOLN
1.0000 [drp] | OPHTHALMIC | Status: DC | PRN
  Filled 2019-08-02: qty 118

## 2019-08-02 MED ORDER — TETRACAINE HCL 0.5 % OP SOLN
1.0000 [drp] | Freq: Once | OPHTHALMIC | Status: DC
  Filled 2019-08-02: qty 4

## 2019-08-02 MED ORDER — GENTAMICIN SULFATE 0.3 % OP SOLN: 2.0000 [drp] | Freq: Four times a day (QID) | OPHTHALMIC | 0 refills | Status: AC

## 2019-08-02 NOTE — ED Provider Notes (Signed)
[facility] Emergency Department Provider Note   ____________________________________________   First MD Initiated Contact with Patient 08/02/19 1127     (approximate)  I have reviewed the triage vital signs and the nursing notes.   HISTORY  Chief Complaint Eye Pain   HPI Gabrielle Howell is a 23 y.o. female presents to the ED with complaint of left eye irritation.  Patient states that she has some drainage from her eyes for the last 2 to 3 days.  She denies any matting of her eyelashes.  She woke this morning with some minimal drainage to the corner of her eye.  She denies any visual changes.  She states that she normally wears glasses and did not bring them.  She denies any injury or seasonal allergies.  She rates her pain as 5 out of 10.      History reviewed. No pertinent past medical history.  Patient Active Problem List   Diagnosis Date Noted  . Pregnancy 10/20/2018  . Vaginal itching 09/01/2018  . Labor and delivery indication for care or intervention 06/09/2015  . Indication for care or intervention related to labor and delivery 06/09/2015    Past Surgical History:  Procedure Laterality Date  . NO PAST SURGERIES      Prior to Admission medications   Medication Sig Start Date End Date Taking? Authorizing Provider  gentamicin (GARAMYCIN) 0.3 % ophthalmic solution Place 2 drops into the left eye 4 (four) times daily for 10 days. 08/02/19 08/12/19  Tommi Rumps, PA-C  lidocaine (XYLOCAINE) 2 % solution Use as directed 5 mLs in the mouth or throat every 6 (six) hours as needed for mouth pain. For oral swish and swallow. 06/13/19   Joni Reining, PA-C    Allergies Patient has no known allergies.  No family history on file.  Social History Social History   Tobacco Use  . Smoking status: Never Smoker  . Smokeless tobacco: Never Used  Substance Use Topics  . Alcohol use: No  . Drug use: Not Currently    Types: Marijuana     Review of Systems Constitutional: No fever/chills Eyes: No visual changes.  Left eye irritation positive. ENT: No sore throat. Cardiovascular: Denies chest pain. Respiratory: Denies shortness of breath. Musculoskeletal: Negative for back pain. Skin: Negative for rash. Neurological: Negative for headaches ____________________________________________   PHYSICAL EXAM:  VITAL SIGNS: ED Triage Vitals [08/02/19 1103]  Enc Vitals Group     BP 110/69     Pulse Rate 73     Resp 18     Temp 98.2 F (36.8 C)     Temp Source Oral     SpO2 99 %     Weight 210 lb (95.3 kg)     Height 5\' 7"  (1.702 m)     Head Circumference      Peak Flow      Pain Score 5     Pain Loc      Pain Edu?      Excl. in GC?     Constitutional: Alert and oriented. Well appearing and in no acute distress. Eyes: Conjunctivae on the left is mildly injected.  No photophobia.  PERRL. EOMI. tetracaine was applied to the left eye.  Lid was everted no foreign body was noted.  Fluorescein stain did not show a corneal abrasion.  Eye was irrigated with eyewash. Head: Atraumatic. Nose: No congestion/rhinnorhea. Neck: No stridor.   Cardiovascular: Normal rate, regular rhythm. Grossly normal heart sounds.  Good peripheral circulation. Respiratory: Normal respiratory effort.  No retractions. Lungs CTAB. Neurologic:  Normal speech and language. No gross focal neurologic deficits are appreciated. No gait instability. Skin:  Skin is warm, dry and intact. No rash noted. Psychiatric: Mood and affect are normal. Speech and behavior are normal.  ____________________________________________   LABS (all labs ordered are listed, but only abnormal results are displayed)  Labs Reviewed - No data to display  PROCEDURES  Procedure(s) performed (including Critical Care):  Procedures   ____________________________________________   INITIAL IMPRESSION / ASSESSMENT AND PLAN / ED COURSE  As part of my medical decision  making, I reviewed the following data within the electronic MEDICAL RECORD NUMBER Notes from prior ED visits and [place] Controlled Substance Database  23 year old female presents to the ED with complaint of left eye pain.  Patient states that she has had some exudate from her eye for the last 2 to 3 days.  She denies any injury or exposure to pinkeye.  She states that she normally wears glasses but does not have them with her today which is why there is no visual acuity.  Eye exam did not show a corneal abrasion.  Patient had no photophobia.  She was given a prescription for gentamicin ophthalmic solution to apply 4 times a day.  If she is not improving she is instructed to follow-up at [facility].  ____________________________________________   FINAL CLINICAL IMPRESSION(S) / ED DIAGNOSES  Final diagnoses:  Acute conjunctivitis of left eye, unspecified acute conjunctivitis type     ED Discharge Orders         Ordered    gentamicin (GARAMYCIN) 0.3 % ophthalmic solution  4 times daily     08/02/19 1142           Note:  This document was prepared using Dragon voice recognition software and may include unintentional dictation errors.    Johnn Hai, PA-C 08/02/19 1420    Earleen Newport, MD 08/02/19 1440

## 2019-08-02 NOTE — ED Notes (Signed)
Says left eye red, some exudate for 2-3 days.  No injury.  She is unable to see anything on the eye chart for visual acuity, but she normally wears glasses and does not have them with her.

## 2019-08-02 NOTE — Discharge Instructions (Signed)
Follow-up with [facility] if any continued problems or not improving.  Contact information and address is listed on your discharge papers.  Begin using eyedrops 4 times a day as directed.

## 2019-08-02 NOTE — ED Triage Notes (Signed)
Pt c/o left eye irritation for the past 4 days, denies injury

## 2019-08-08 DIAGNOSIS — Z6834 Body mass index (BMI) 34.0-34.9, adult: Secondary | ICD-10-CM

## 2019-08-08 DIAGNOSIS — E669 Obesity, unspecified: Secondary | ICD-10-CM

## 2019-09-11 ENCOUNTER — Other Ambulatory Visit: Payer: Self-pay

## 2019-09-11 ENCOUNTER — Emergency Department
Admission: EM | Admit: 2019-09-11 | Discharge: 2019-09-11 | Disposition: A | Discharge status: Home / Self Care | Payer: Medicaid Other | Attending: Emergency Medicine | Admitting: Emergency Medicine

## 2019-09-11 ENCOUNTER — Emergency Department: Payer: Medicaid Other

## 2019-09-11 ENCOUNTER — Encounter: Payer: Self-pay | Admitting: Emergency Medicine

## 2019-09-11 DIAGNOSIS — O219 Vomiting of pregnancy, unspecified: Secondary | ICD-10-CM | POA: Diagnosis present

## 2019-09-11 DIAGNOSIS — R109 Unspecified abdominal pain: Secondary | ICD-10-CM

## 2019-09-11 DIAGNOSIS — J45909 Unspecified asthma, uncomplicated: Secondary | ICD-10-CM | POA: Insufficient documentation

## 2019-09-11 DIAGNOSIS — Z3A11 11 weeks gestation of pregnancy: Secondary | ICD-10-CM | POA: Diagnosis not present

## 2019-09-11 LAB — CBC
HCT: 39.1 % (ref 36.0–46.0)
Hemoglobin: 14 g/dL (ref 12.0–15.0)
MCH: 30.4 pg (ref 26.0–34.0)
MCHC: 35.8 g/dL (ref 30.0–36.0)
MCV: 84.8 fL (ref 80.0–100.0)
Platelets: 219 10*3/uL (ref 150–400)
RBC: 4.61 MIL/uL (ref 3.87–5.11)
RDW: 11.8 % (ref 11.5–15.5)
WBC: 5.2 10*3/uL (ref 4.0–10.5)
nRBC: 0 % (ref 0.0–0.2)

## 2019-09-11 LAB — URINALYSIS, COMPLETE (UACMP) WITH MICROSCOPIC
Glucose, UA: NEGATIVE mg/dL
Ketones, ur: 80 mg/dL — AB
Nitrite: NEGATIVE
Protein, ur: >=300 mg/dL — AB
RBC / HPF: >50 RBC/hpf — ABNORMAL HIGH (ref 0–5)
Specific Gravity, Urine: 1.037 — ABNORMAL HIGH (ref 1.005–1.030)
pH: 6 (ref 5.0–8.0)

## 2019-09-11 LAB — COMPREHENSIVE METABOLIC PANEL
ALT: 21 U/L (ref 0–44)
AST: 24 U/L (ref 15–41)
Albumin: 4 g/dL (ref 3.5–5.0)
Alkaline Phosphatase: 49 U/L (ref 38–126)
Anion gap: 13 (ref 5–15)
BUN: 15 mg/dL (ref 6–20)
CO2: 24 mmol/L (ref 22–32)
Calcium: 9.4 mg/dL (ref 8.9–10.3)
Chloride: 99 mmol/L (ref 98–111)
Creatinine, Ser: 0.68 mg/dL (ref 0.44–1.00)
GFR calc Af Amer: >60 mL/min (ref >60)
GFR calc non Af Amer: >60 mL/min (ref >60)
Glucose, Bld: 91 mg/dL (ref 70–99)
Potassium: 3.8 mmol/L (ref 3.5–5.1)
Sodium: 136 mmol/L (ref 135–145)
Total Bilirubin: 1.2 mg/dL (ref 0.3–1.2)
Total Protein: 9.2 g/dL — ABNORMAL HIGH (ref 6.5–8.1)

## 2019-09-11 LAB — LIPASE, BLOOD: Lipase: 22 U/L (ref 11–51)

## 2019-09-11 LAB — HCG, QUANTITATIVE, PREGNANCY: hCG, Beta Chain, Quant, S: 612048 m[IU]/mL — ABNORMAL HIGH (ref <5)

## 2019-09-11 LAB — POCT PREGNANCY, URINE: Preg Test, Ur: POSITIVE — AB

## 2019-09-11 MED ORDER — LACTATED RINGERS IV BOLUS: 1000.0000 mL | Freq: Once | INTRAVENOUS | Status: DC

## 2019-09-11 MED ORDER — ONDANSETRON HCL 4 MG/2ML IJ SOLN: 4.0000 mg | Freq: Once | INTRAMUSCULAR | Status: DC

## 2019-09-11 MED ORDER — ONDANSETRON 4 MG PO TBDP
4.0000 mg | ORAL_TABLET | Freq: Once | ORAL | Status: AC
  Administered 2019-09-11: 14:00:00 4 mg via ORAL
  Filled 2019-09-11: qty 1

## 2019-09-11 MED ORDER — ONDANSETRON 4 MG PO TBDP: 4.0000 mg | ORAL_TABLET | Freq: Three times a day (TID) | ORAL | 0 refills | Status: DC | PRN

## 2019-09-11 NOTE — ED Provider Notes (Signed)
[facility] Emergency Department Provider Note   ____________________________________________   First MD Initiated Contact with Patient 09/11/19 1300     (approximate)  I have reviewed the triage vital signs and the nursing notes.   HISTORY  Chief Complaint Emesis    HPI Gabrielle Howell is a 23 y.o. female with possible history of asthma presents to the ED complaining of nausea and vomiting.  Patient reports that she has had approximately 2 weeks of nausea and multiple episodes of vomiting per day, describes difficulty tolerating anything by mouth.  She denies any associated abdominal pain and has not had any diarrhea.  She also denies any fevers, chills, cough, chest pain, shortness of breath, dysuria, or hematuria.  She states her LMP was at the end of September and she is concerned she might be pregnant.  She has had 2 prior pregnancies that were full-term and without complication.        Past Medical History:  Diagnosis Date  . Asthma     Patient Active Problem List   Diagnosis Date Noted  . Obesity, unspecified 12/01/2018  . Vaginal itching 09/01/2018    Past Surgical History:  Procedure Laterality Date  . NO PAST SURGERIES      Prior to Admission medications   Medication Sig Start Date End Date Taking? Authorizing Provider  ondansetron (ZOFRAN ODT) 4 MG disintegrating tablet Take 1 tablet (4 mg total) by mouth every 8 (eight) hours as needed for nausea or vomiting. 09/11/19   Chesley Noon, MD    Allergies Patient has no known allergies.  Family History  Problem Relation Age of Onset  . Cerebral palsy Half-Sister     Social History Social History   Tobacco Use  . Smoking status: Never Smoker  . Smokeless tobacco: Never Used  Substance Use Topics  . Alcohol use: No  . Drug use: Not Currently    Types: Marijuana    Review of Systems  Constitutional: No fever/chills Eyes: No visual changes. ENT: No sore throat.  Cardiovascular: Denies chest pain. Respiratory: Denies shortness of breath. Gastrointestinal: No abdominal pain.  Positive for nausea and vomiting.  No diarrhea.  No constipation. Genitourinary: Negative for dysuria. Musculoskeletal: Negative for back pain. Skin: Negative for rash. Neurological: Negative for headaches, focal weakness or numbness.  ____________________________________________   PHYSICAL EXAM:  VITAL SIGNS: ED Triage Vitals  Enc Vitals Group     BP 09/11/19 1213 135/76     Pulse Rate 09/11/19 1213 (!) 104     Resp 09/11/19 1213 16     Temp 09/11/19 1213 99 F (37.2 C)     Temp Source 09/11/19 1213 Oral     SpO2 09/11/19 1213 100 %     Weight 09/11/19 1159 200 lb (90.7 kg)     Height 09/11/19 1159 5\' 7"  (1.702 m)     Head Circumference --      Peak Flow --      Pain Score 09/11/19 1159 0     Pain Loc --      Pain Edu? --      Excl. in GC? --     Constitutional: Alert and oriented. Eyes: Conjunctivae are normal. Head: Atraumatic. Nose: No congestion/rhinnorhea. Mouth/Throat: Mucous membranes are moist. Neck: Normal ROM Cardiovascular: Normal rate, regular rhythm. Grossly normal heart sounds. Respiratory: Normal respiratory effort.  No retractions. Lungs CTAB. Gastrointestinal: Soft and nontender. No distention. Genitourinary: deferred Musculoskeletal: No lower extremity tenderness nor edema. Neurologic:  Normal speech  and language. No gross focal neurologic deficits are appreciated. Skin:  Skin is warm, dry and intact. No rash noted. Psychiatric: Mood and affect are normal. Speech and behavior are normal.  ____________________________________________   LABS (all labs ordered are listed, but only abnormal results are displayed)  Labs Reviewed  COMPREHENSIVE METABOLIC PANEL - Abnormal; Notable for the following components:      Result Value   Total Protein 9.2 (*)    All other components within normal limits  URINALYSIS, COMPLETE (UACMP) WITH  MICROSCOPIC - Abnormal; Notable for the following components:   Color, Urine AMBER (*)    APPearance CLOUDY (*)    Specific Gravity, Urine 1.037 (*)    Hgb urine dipstick SMALL (*)    Bilirubin Urine SMALL (*)    Ketones, ur 80 (*)    Protein, ur >=300 (*)    Leukocytes,Ua TRACE (*)    RBC / HPF >50 (*)    Bacteria, UA RARE (*)    All other components within normal limits  POCT PREGNANCY, URINE - Abnormal; Notable for the following components:   Preg Test, Ur POSITIVE (*)    All other components within normal limits  URINE CULTURE  LIPASE, BLOOD  CBC  HCG, QUANTITATIVE, PREGNANCY  POC URINE PREG, ED    PROCEDURES  Procedure(s) performed (including Critical Care):  Procedures   ____________________________________________   INITIAL IMPRESSION / ASSESSMENT AND PLAN / ED COURSE       23 year old female, with 2 prior uncomplicated pregnancies, presents to the ED for persistent nausea and vomiting over the past 2 weeks.  She states her LMP was at the end of September and point-of-care pregnancy testing is positive today.  She denies any significant pain or bleeding, has nonfocal abdominal exam.  Will perform obstetric ultrasound to ensure no ectopic pregnancy or other abnormality.  Lab work is unremarkable, no evidence of significant dehydration, LFTs and lipase within normal limits.  She is requesting water to drink here, will give dose of p.o. Zofran.  Ultrasound results and UA are pending.  UA shows blood but no clear evidence of infection, especially given numerous squamous cells.  Ultrasound is reassuring, shows normal intrauterine pregnancy at approximately 11 weeks.  Patient's nausea and vomiting is improved and she is tolerating p.o. here.  She is appropriate for discharge home with OB/GYN follow-up, counseled to return to the ED for new or worsening symptoms.  Patient agrees with plan.      ____________________________________________   FINAL CLINICAL IMPRESSION(S) /  ED DIAGNOSES  Final diagnoses:  Nausea and vomiting in pregnancy  [redacted] weeks gestation of pregnancy     ED Discharge Orders         Ordered    ondansetron (ZOFRAN ODT) 4 MG disintegrating tablet  Every 8 hours PRN     09/11/19 1449           Note:  This document was prepared using Dragon voice recognition software and may include unintentional dictation errors.   Blake Divine, MD 09/11/19 1451

## 2019-09-11 NOTE — ED Triage Notes (Signed)
Pt states took a pregnancy test 2 weeks ago and it was positive and she keeps throwing up. Pt has not had a prenatal visit yet.

## 2019-09-11 NOTE — ED Notes (Addendum)
See triage note presents with positive pregnancy test at home  And she has not been able to keep anything down  Low grade fever on arrival

## 2019-09-11 NOTE — ED Notes (Signed)
Lab notified to run Urinalysis.

## 2019-09-11 NOTE — ED Notes (Signed)
Called lab spoke to [place] she is adding on culture to UA.

## 2019-09-11 NOTE — ED Notes (Signed)
EDP, [place] at bedside.

## 2019-09-12 LAB — URINE CULTURE: Culture: <10000 — AB

## 2019-09-29 NOTE — L&D Delivery Note (Addendum)
Delivery Note  Date of delivery: 03/22/20 Estimated Date of Delivery: 03/31/20 Patient's last menstrual period was 07/25/2019 (approximate). EGA: [redacted]w[redacted]d  Delivery Note At 12:16 AM a viable female was delivered via Vaginal, Spontaneous (Presentation: OA).  APGAR: 8, 9; weight  2660g 5lb13.8oz.    Placenta status: Spontaneous, Intact.  Cord: 3V  with the following complications: none.   Anesthesia:  None Episiotomy:  None Lacerations:  None Suture Repair: n/a Est. Blood Loss (mL):  43mL    First Stage: Labor onset: 2237 Augmentation : None Analgesia /Anesthesia intrapartum: Fentanyl SROM at 0014  Second Stage: Complete dilation at 0014 Onset of pushing at 0014 FHR second stage Cat I Delivery at 0016 on 03/26/2020  She progressed to complete and had a spontaneous vaginal birth of a live female over an intact perineum. The fetal head was delivered in OA position. No nuchal cord. Anterior then posterior shoulders delivered spontaneously. Baby placed on mom's abdomen and attended to by transition RN. Cord clamped and cut when pulseless by FOB.   Third Stage: Placenta delivered  intact with 3VC at 0028 Placenta disposition: routine disposal Uterine tone firm / bleeding min IM pitocin given for hemorrhage prophylaxis - IV was lost during pushing  No laceration identified  Anesthesia for repair: n/a Repair n/a Est. Blood Loss (mL): 50  Complications: None  Mom to postpartum.  Baby to Couplet care / Skin to Skin.  Newborn: Birth Weight: 2660g 5lb13.8oz  Apgar Scores: 8, 9 Feeding planned: breast   Cyril Mourning, CNM 03/26/2020 9:31 AM

## 2019-11-16 ENCOUNTER — Encounter: Payer: Self-pay | Admitting: Family Medicine

## 2019-11-16 ENCOUNTER — Other Ambulatory Visit: Payer: Self-pay

## 2019-11-16 ENCOUNTER — Ambulatory Visit: Payer: Medicaid Other | Admitting: Family Medicine

## 2019-11-16 VITALS — BP 107/71 | HR 76 | Temp 97.4°F | Wt 224.6 lb

## 2019-11-16 DIAGNOSIS — Z3480 Encounter for supervision of other normal pregnancy, unspecified trimester: Secondary | ICD-10-CM

## 2019-11-16 DIAGNOSIS — O99210 Obesity complicating pregnancy, unspecified trimester: Secondary | ICD-10-CM

## 2019-11-16 LAB — URINALYSIS
Bilirubin, UA: NEGATIVE
Glucose, UA: NEGATIVE
Ketones, UA: NEGATIVE
Leukocytes,UA: NEGATIVE
Nitrite, UA: NEGATIVE
Protein,UA: NEGATIVE
Specific Gravity, UA: 1.02 (ref 1.005–1.030)
Urobilinogen, Ur: 1 mg/dL (ref 0.2–1.0)
pH, UA: 7 (ref 5.0–7.5)

## 2019-11-16 LAB — HIV ANTIBODY (ROUTINE TESTING W REFLEX): HIV 1&2 Ab, 4th Generation: NONREACTIVE

## 2019-11-16 LAB — WET PREP FOR TRICH, YEAST, CLUE
Trichomonas Exam: NEGATIVE
Yeast Exam: NEGATIVE

## 2019-11-16 LAB — HEMOGLOBIN, FINGERSTICK: Hemoglobin: 12.4 g/dL (ref 11.1–15.9)

## 2019-11-16 MED ORDER — PREPLUS 27-1 MG PO TABS: ORAL_TABLET | ORAL | 3 refills | Status: DC

## 2019-11-16 NOTE — Progress Notes (Signed)
[place] [id number] [phone]  INITIAL PRENATAL VISIT NOTE  Subjective:  Gabrielle Howell is a 24 y.o. G3P2002 at [redacted]w[redacted]d being seen today to start prenatal care at the [facility].  She is currently monitored for the following issues for this low-risk pregnancy and has Obesity in pregnancy and Supervision of other normal pregnancy, antepartum on their problem list.  Pt is here for initial prenatal visit. She is [redacted]w[redacted]d today, has known about pregnancy since 11 weeks but had delays in making initial appointment until now. She lives with her 2 children, ages 42 and 43 yo. She is not currently working. FOB will be involved in this pregnancy. Physically she continues to have nausea, though vomiting is greatly improved from early pregnancy when she presented to ER for n/v at 11 wks. She has no chronic medical issues, takes no medications daily. She does not drink, smoke or use drugs. Her last pap was 11/2018, result NIL. Her prior 2 births were SVD at term, healthy weights, denies complications including preeclampsia or GHTN. She has a family hx of cerebral palsy in her sister. She declines quad screen today. PHq-9 score 2, she declines beh health referral.   Patient reports backache - low back, has been present since prior pregnancy.    Contractions: Not present. Vag. Bleeding: None.  Movement: Present. Denies leaking of fluid.   The following portions of the patient's history were reviewed and updated as appropriate: allergies, current medications, past family history, past medical history, past social history, past surgical history and problem list. Problem list updated.  Objective:   Vitals:   11/16/19 1348  BP: 107/71  Pulse: 76  Temp: (!) 97.4 F (36.3 C)  Weight: 224 lb 9.6 oz (101.9 kg)    Fetal Status: Fetal Heart Rate (bpm): 140 Fundal Height: 20 cm Movement:  Present  Presentation: Undeterminable   Physical Exam Vitals and nursing note reviewed.  Constitutional:      General: She is not in acute distress.    Appearance: Normal appearance. She is well-developed.  HENT:     Head: Normocephalic and atraumatic.     Right Ear: External ear normal.     Left Ear: External ear normal.     Nose: Nose normal. No congestion or rhinorrhea.     Mouth/Throat:     Lips: Pink.     Mouth: Mucous membranes are moist.     Dentition: Normal dentition. No dental caries.     Pharynx: Oropharynx is clear. Uvula midline.  Eyes:     General: No scleral icterus.    Conjunctiva/sclera: Conjunctivae normal.  Neck:     Thyroid: No thyroid mass or thyromegaly.  Cardiovascular:     Rate and Rhythm: Normal rate.     Pulses: Normal pulses.     Comments: Extremities are warm and well perfused Pulmonary:     Effort: Pulmonary effort is normal.     Breath sounds: Normal breath sounds.  Chest:     Breasts: Breasts are symmetrical.        Right: Normal. No mass, nipple discharge or skin change.        Left: Normal. No mass, nipple discharge or skin change.  Abdominal:     General: Abdomen is flat.     Palpations: Abdomen is soft.     Tenderness: There is no abdominal tenderness.     Comments: Gravid  Genitourinary:    General: Normal vulva.     Exam position: Lithotomy position.     Pubic Area: No rash.      Labia:        Right: No rash.        Left: No rash.      Vagina: Vaginal discharge (white, moderate amount, ph <4.5) present.     Cervix: No cervical motion tenderness or friability.     Uterus: Normal. Enlarged (Gravid 20 wk size ). Not tender.      Adnexa: Right adnexa normal and left adnexa normal.     Rectum: Normal. No external hemorrhoid.  Musculoskeletal:     Right lower leg: No edema.     Left lower leg: No edema.  Lymphadenopathy:     Upper Body:     Right upper body: No axillary adenopathy.     Left upper body: No axillary adenopathy.   Skin:    General: Skin is warm.     Capillary Refill: Capillary refill takes less than 2 seconds.  Neurological:     Mental Status: She is alert.     Assessment and Plan:  Pregnancy: G3P2002 at [redacted]w[redacted]d    1. Supervision of other normal pregnancy, antepartum -Initial OB visit today. [redacted]w[redacted]d today by [redacted]w[redacted]d [place].  -Referral made today for Anatomy US with [facility]. Advised pt to call if she has not heard about appt w/in 1 wk.  -She declines quad screen.  -Prescribed PNV to pt's pharmacy, advised to begin asap. -Discussed back pain relief strategies including rest, baths, belly band as pregnancy progresses, exercises and tylenol. Handout with exercises given. Advised to let us know if no relief or worsens.  -BP cuff to be prescribed by Dr. Alvester Morin. Advised pt to change f/u appt to telehealth if she desires and BP cuff arrives in time. - Prenatal profile without Varicella or Rubella - Chlamydia/GC NAA, Confirmation - Urine Culture - HIV  LAB - Lead, blood (adult age 76 yrs or greater) - WET PREP FOR TRICH, YEAST, CLUE - Urinalysis (Urine Dip) - Hemoglobin, venipuncture - Prenatal Vit-Fe Fumarate-FA (PREPLUS) 27-1 MG TABS; Take 1 tablet by mouth daily while pregnant or breastfeeding.  Dispense: 100 tablet; Refill: 3  2. Obesity in pregnancy -Early GTT today, add'l labs as listed. -Healthy weight gain discussed. Pt declines MNT referral. -Pt accepts aspirin at 12 wks, handout given. -May consider serial growth scans starting at 28 wks. - Glucose tolerance, 1 hour - TSH - Comprehensive metabolic panel - Hgb A1c w/o eAG - Protein / creatinine ratio, urine    Discussed overview of care and coordination with inpatient delivery practices including [facility], [facility], [facility] and [facility].     Preterm labor symptoms and general obstetric precautions including but not limited to vaginal bleeding, contractions, leaking of fluid and fetal movement were reviewed in detail with the  patient.  Please refer to After Visit Summary for other counseling recommendations.   Return in about 4 weeks (around 12/14/2019) for routine prenatal care.  Future Appointments  Date Time Provider Department Center  12/14/2019 11:00 AM [facility] PROVIDER [facility] None    Ann Held, PA-C

## 2019-11-16 NOTE — Progress Notes (Signed)
Chart abstracted per 11/15/19 phone interview by Hal Hope, RN; Sharlette Dense, RN

## 2019-11-16 NOTE — Progress Notes (Signed)
Patient wet mount reviewed, no new orders. Flu vaccine and quad screen declined, quad declination form signed today. [facility] U/S referral faxed, appointment pending. PNV order called in to patient pharmacy. Burt Knack, RN

## 2019-11-16 NOTE — Progress Notes (Signed)
Patient here for new OB visit at 20 4/7. Patient history done by phone by Hal Hope, RN. Needs PNV. Patient had U/S at [facility] in 08/2019. High BMI labs to be done today. Patient declines flu vaccine. Quad screen declination signed today. Burt Knack, RN

## 2019-11-17 ENCOUNTER — Telehealth: Payer: Self-pay

## 2019-11-17 ENCOUNTER — Other Ambulatory Visit: Payer: Self-pay | Admitting: Family Medicine

## 2019-11-17 DIAGNOSIS — Z3480 Encounter for supervision of other normal pregnancy, unspecified trimester: Secondary | ICD-10-CM

## 2019-11-17 LAB — COMPREHENSIVE METABOLIC PANEL
ALT: 8 IU/L (ref 0–32)
AST: 15 IU/L (ref 0–40)
Albumin/Globulin Ratio: 1.1 — ABNORMAL LOW (ref 1.2–2.2)
Albumin: 3.8 g/dL — ABNORMAL LOW (ref 3.9–5.0)
Alkaline Phosphatase: 52 IU/L (ref 39–117)
BUN/Creatinine Ratio: 13 (ref 9–23)
BUN: 8 mg/dL (ref 6–20)
Bilirubin Total: <0.2 mg/dL (ref 0.0–1.2)
CO2: 22 mmol/L (ref 20–29)
Calcium: 9.4 mg/dL (ref 8.7–10.2)
Chloride: 100 mmol/L (ref 96–106)
Creatinine, Ser: 0.64 mg/dL (ref 0.57–1.00)
GFR calc Af Amer: 146 mL/min/{1.73_m2} (ref >59)
GFR calc non Af Amer: 127 mL/min/{1.73_m2} (ref >59)
Globulin, Total: 3.4 g/dL (ref 1.5–4.5)
Glucose: 104 mg/dL — ABNORMAL HIGH (ref 65–99)
Potassium: 3.7 mmol/L (ref 3.5–5.2)
Sodium: 136 mmol/L (ref 134–144)
Total Protein: 7.2 g/dL (ref 6.0–8.5)

## 2019-11-17 LAB — CBC/D/PLT+RPR+RH+ABO+AB SCR
Basophils Absolute: 0 10*3/uL (ref 0.0–0.2)
Basos: 0 %
EOS (ABSOLUTE): 0 10*3/uL (ref 0.0–0.4)
Eos: 1 %
Hematocrit: 36.3 % (ref 34.0–46.6)
Hemoglobin: 12.7 g/dL (ref 11.1–15.9)
Hepatitis B Surface Ag: NEGATIVE
Immature Grans (Abs): 0.1 10*3/uL (ref 0.0–0.1)
Immature Granulocytes: 1 %
Lymphocytes Absolute: 1.6 10*3/uL (ref 0.7–3.1)
Lymphs: 27 %
MCH: 31.7 pg (ref 26.6–33.0)
MCHC: 35 g/dL (ref 31.5–35.7)
MCV: 91 fL (ref 79–97)
Monocytes Absolute: 0.6 10*3/uL (ref 0.1–0.9)
Monocytes: 10 %
Neutrophils Absolute: 3.5 10*3/uL (ref 1.4–7.0)
Neutrophils: 61 %
Platelets: 232 10*3/uL (ref 150–450)
RBC: 4.01 x10E6/uL (ref 3.77–5.28)
RDW: 12.6 % (ref 11.7–15.4)
RPR Ser Ql: NONREACTIVE
Rh Factor: POSITIVE
WBC: 5.7 10*3/uL (ref 3.4–10.8)

## 2019-11-17 LAB — TSH: TSH: 1.37 u[IU]/mL (ref 0.450–4.500)

## 2019-11-17 LAB — HGB A1C W/O EAG: Hgb A1c MFr Bld: 5 % (ref 4.8–5.6)

## 2019-11-17 LAB — AB SCR+ANTIBODY ID: Antibody Screen: POSITIVE — AB

## 2019-11-17 LAB — GLUCOSE, 1 HOUR GESTATIONAL: Gestational Diabetes Screen: 99 mg/dL (ref 65–139)

## 2019-11-17 LAB — LEAD, BLOOD (ADULT >= 16 YRS): Lead-Whole Blood: <1 ug/dL (ref 0–4)

## 2019-11-17 MED ORDER — PREPLUS 27-1 MG PO TABS: ORAL_TABLET | ORAL | 3 refills | Status: DC

## 2019-11-17 NOTE — Telephone Encounter (Signed)
Phone call to patient and informed of scheduled [facility] ultrasound appt for 11/27/2019 @ 11:00am. Instructed patient to arrive by 10:45 for check in and to wear a mask. Also per instruction of [facility] staff patient is to be in ultrasound room alone due to Covid restrictions. Patient verbalized understanding of above. Tawny Hopping, RN

## 2019-11-18 LAB — CHLAMYDIA/GC NAA, CONFIRMATION
Chlamydia trachomatis, NAA: NEGATIVE
Neisseria gonorrhoeae, NAA: NEGATIVE

## 2019-11-18 LAB — PROTEIN / CREATININE RATIO, URINE
Creatinine, Urine: 107.4 mg/dL
Protein, Ur: 10.8 mg/dL
Protein/Creat Ratio: 101 mg/g creat (ref 0–200)

## 2019-11-18 LAB — URINE CULTURE

## 2019-11-27 NOTE — Progress Notes (Signed)
HIV results added to EMR.

## 2019-12-01 ENCOUNTER — Encounter: Payer: Self-pay | Admitting: Family Medicine

## 2019-12-01 DIAGNOSIS — O0930 Supervision of pregnancy with insufficient antenatal care, unspecified trimester: Secondary | ICD-10-CM | POA: Insufficient documentation

## 2019-12-01 DIAGNOSIS — Z3480 Encounter for supervision of other normal pregnancy, unspecified trimester: Secondary | ICD-10-CM

## 2019-12-14 ENCOUNTER — Other Ambulatory Visit: Payer: Self-pay

## 2019-12-14 ENCOUNTER — Ambulatory Visit: Payer: Medicaid Other | Admitting: Family Medicine

## 2019-12-14 VITALS — BP 100/62 | HR 86 | Temp 97.9°F | Wt 229.4 lb

## 2019-12-14 DIAGNOSIS — Z3480 Encounter for supervision of other normal pregnancy, unspecified trimester: Secondary | ICD-10-CM

## 2019-12-14 DIAGNOSIS — O0930 Supervision of pregnancy with insufficient antenatal care, unspecified trimester: Secondary | ICD-10-CM

## 2019-12-14 DIAGNOSIS — O219 Vomiting of pregnancy, unspecified: Secondary | ICD-10-CM

## 2019-12-14 DIAGNOSIS — O99210 Obesity complicating pregnancy, unspecified trimester: Secondary | ICD-10-CM

## 2019-12-14 MED ORDER — ONDANSETRON 4 MG PO TBDP: 4.0000 mg | ORAL_TABLET | Freq: Four times a day (QID) | ORAL | 0 refills | Status: DC | PRN

## 2019-12-14 NOTE — Progress Notes (Signed)
   PRENATAL VISIT NOTE  Subjective:  Gabrielle Howell is a 24 y.o. G3P2002 at [redacted]w[redacted]d being seen today for ongoing prenatal care.  She is currently monitored for the following issues for this low-risk pregnancy and has Obesity in pregnancy; Supervision of other normal pregnancy, antepartum; and Late prenatal care, antepartum on their problem list.  Patient reports nausea and vomiting- still having vomitting x1 in the morning.  Contractions: Not present. Vag. Bleeding: None.  Movement: Present. Denies leaking of fluid/ROM.   The following portions of the patient's history were reviewed and updated as appropriate: allergies, current medications, past family history, past medical history, past social history, past surgical history and problem list. Problem list updated.  Objective:   Vitals:   12/14/19 1055  BP: 100/62  Pulse: 86  Temp: 97.9 F (36.6 C)  Weight: 229 lb 6.4 oz (104.1 kg)    Fetal Status: Fetal Heart Rate (bpm): 140 Fundal Height: 25 cm Movement: Present     General:  Alert, oriented and cooperative. Patient is in no acute distress.  Skin: Skin is warm and dry. No rash noted.   Cardiovascular: Normal heart rate noted  Respiratory: Normal respiratory effort, no problems with respiration noted  Abdomen: Soft, gravid, appropriate for gestational age.  Pain/Pressure: Absent     Pelvic: Cervical exam deferred        Extremities: Normal range of motion.  Edema: None  Mental Status: Normal mood and affect. Normal behavior. Normal judgment and thought content.   Assessment and Plan:  Pregnancy: G3P2002 at [redacted]w[redacted]d  1. Late prenatal care, antepartum Up to date currently   2. Obesity in pregnancy TWG=31 lb 6.4 oz (14.2 kg)  Which is   3. Supervision of other normal pregnancy, antepartum Nausea- recommended unisom and B6. Rx for zofran sent to pharmacy Reviewed 28 wk labs at next visit   Preterm labor symptoms and general obstetric precautions including but not limited to  vaginal bleeding, contractions, leaking of fluid and fetal movement were reviewed in detail with the patient. Please refer to After Visit Summary for other counseling recommendations.  Return in about 4 weeks (around 01/11/2020) for Routine prenatal care, 28 wk labs.  Future Appointments  Date Time Provider Department Center  01/11/2020 10:20 AM [facility] PROVIDER [facility] None    Federico Flake, MD

## 2019-12-14 NOTE — Progress Notes (Signed)
In for visit; has not picked up PNV yet @ pharmacy; denies hospital visits since last appt; CCNC & PHQ9 completed; discussed labs needed @ next visit Sharlette Dense, RN

## 2019-12-21 NOTE — Addendum Note (Signed)
Addended by: Heywood Bene on: 12/21/2019 11:05 AM   Modules accepted: Orders

## 2020-01-11 ENCOUNTER — Ambulatory Visit: Payer: Medicaid Other

## 2020-01-19 ENCOUNTER — Telehealth: Payer: Self-pay

## 2020-01-19 NOTE — Telephone Encounter (Signed)
Attempted to call to schedule f/u appt; client's # not working and per female at [employer] #" Sharlette Dense, RN

## 2020-01-23 NOTE — Telephone Encounter (Signed)
Client needs to reschedule previously missed [facility] RV appt. Call to client's number and per recorded message, call can't be completed as dialed. Re-dialed number with same message. No emergency contact number. Sticky note created to verify # / obtain emergency contact #. Jossie Ng, RN

## 2020-01-25 ENCOUNTER — Telehealth: Payer: Self-pay | Admitting: Family Medicine

## 2020-01-25 NOTE — Telephone Encounter (Signed)
NOT ABLE TO REACH PATIENT.

## 2020-02-07 DIAGNOSIS — O09893 Supervision of other high risk pregnancies, third trimester: Secondary | ICD-10-CM | POA: Insufficient documentation

## 2020-02-12 ENCOUNTER — Ambulatory Visit: Payer: Medicaid Other | Admitting: Advanced Practice Midwife

## 2020-02-12 ENCOUNTER — Other Ambulatory Visit: Payer: Self-pay

## 2020-02-12 VITALS — BP 106/66 | HR 94 | Temp 97.5°F | Wt 233.4 lb

## 2020-02-12 DIAGNOSIS — O99210 Obesity complicating pregnancy, unspecified trimester: Secondary | ICD-10-CM

## 2020-02-12 DIAGNOSIS — Z23 Encounter for immunization: Secondary | ICD-10-CM

## 2020-02-12 DIAGNOSIS — Z3480 Encounter for supervision of other normal pregnancy, unspecified trimester: Secondary | ICD-10-CM | POA: Diagnosis not present

## 2020-02-12 DIAGNOSIS — Z9119 Patient's noncompliance with other medical treatment and regimen: Secondary | ICD-10-CM

## 2020-02-12 DIAGNOSIS — Z91199 Patient's noncompliance with other medical treatment and regimen due to unspecified reason: Secondary | ICD-10-CM | POA: Insufficient documentation

## 2020-02-12 DIAGNOSIS — O0930 Supervision of pregnancy with insufficient antenatal care, unspecified trimester: Secondary | ICD-10-CM

## 2020-02-12 LAB — URINALYSIS
Bilirubin, UA: NEGATIVE
Glucose, UA: NEGATIVE
Nitrite, UA: NEGATIVE
Specific Gravity, UA: 1.03 (ref 1.005–1.030)
Urobilinogen, Ur: 1 mg/dL (ref 0.2–1.0)
pH, UA: 6 (ref 5.0–7.5)

## 2020-02-12 LAB — OB RESULTS CONSOLE HIV ANTIBODY (ROUTINE TESTING): HIV: NONREACTIVE

## 2020-02-12 LAB — HEMOGLOBIN, FINGERSTICK: Hemoglobin: 11.7 g/dL (ref 11.1–15.9)

## 2020-02-12 NOTE — Progress Notes (Signed)
   PRENATAL VISIT NOTE  Subjective:  Gabrielle Howell is a 24 y.o. G3P2002 at [redacted]w[redacted]d being seen today for ongoing prenatal care.  She is currently monitored for the following issues for this low-risk pregnancy and has Obesity in pregnancy; Supervision of other normal pregnancy, antepartum; Late prenatal care, antepartum; Pregnancy complicated by noncompliance in third trimester, antepartum; and Patient noncompliance--no prenatal care x 8 weeks on their problem list.  Patient reports no complaints.  Contractions: Not present. Vag. Bleeding: None.  Movement: Present. Denies leaking of fluid/ROM.   The following portions of the patient's history were reviewed and updated as appropriate: allergies, current medications, past family history, past medical history, past social history, past surgical history and problem list. Problem list updated.  Objective:   Vitals:   02/12/20 0907  BP: 106/66  Pulse: 94  Temp: (!) 97.5 F (36.4 C)  Weight: 233 lb 6.4 oz (105.9 kg)    Fetal Status: Fetal Heart Rate (bpm): 120 Fundal Height: 32 cm Movement: Present     General:  Alert, oriented and cooperative. Patient is in no acute distress.  Skin: Skin is warm and dry. No rash noted.   Cardiovascular: Normal heart rate noted  Respiratory: Normal respiratory effort, no problems with respiration noted  Abdomen: Soft, gravid, appropriate for gestational age.  Pain/Pressure: Absent     Pelvic: Cervical exam deferred        Extremities: Normal range of motion.  Edema: None  Mental Status: Normal mood and affect. Normal behavior. Normal judgment and thought content.   Assessment and Plan:  Pregnancy: G3P2002 at [redacted]w[redacted]d  1. Late prenatal care, antepartum Began at 23 4/7  2. Obesity in pregnancy 35 lb 6.4 oz (16.1 kg) Taking ASA 81 mg daily  3. Need for vaccination  - Tdap vaccine greater than or equal to 7yo IM  4. Supervision of other normal pregnancy, antepartum No prenatal care x 8 weeks (last  appt 12/14/19).  States she "forgot".  Living with her 2 children (1 yo, 27 yo).  Not employed.  FOB not supporting financially but supportive.  Getting ready for baby at home; has car seat.  Denies depression sxs.  States has enough food at home.  Plans to bottle feed and wants DMPA pp.   - HIV [facility] - [facility] - Glucose, 1 hour gestational - Hemoglobin, venipuncture - Urinalysis (Urine Dip)  5. Patient noncompliance--no prenatal care x 8 weeks Last appt 12/14/19.  Agrees to UDS today - [id number] Drug Screen   Preterm labor symptoms and general obstetric precautions including but not limited to vaginal bleeding, contractions, leaking of fluid and fetal movement were reviewed in detail with the patient. Please refer to After Visit Summary for other counseling recommendations.  Return in about 2 weeks (around 02/26/2020).  No future appointments.  Alberteen Spindle, CNM

## 2020-02-12 NOTE — Progress Notes (Signed)
Address, phone number and emergency contact updated. Jossie Ng, RN  Hgb = 11.7 and no iron supplement needed per standing order. Urine dip reviewed by E. Sciora CNM and urine C & S added to labs per her verbal order. Jossie Ng, RN

## 2020-02-13 ENCOUNTER — Telehealth: Payer: Self-pay

## 2020-02-13 DIAGNOSIS — O99810 Abnormal glucose complicating pregnancy: Secondary | ICD-10-CM | POA: Insufficient documentation

## 2020-02-13 LAB — RPR: RPR Ser Ql: NONREACTIVE

## 2020-02-13 LAB — GLUCOSE, 1 HOUR GESTATIONAL: Gestational Diabetes Screen: 140 mg/dL — ABNORMAL HIGH (ref 65–139)

## 2020-02-13 NOTE — Telephone Encounter (Signed)
Returned call-scheduled 34 Hr gtt appt 12/16/19-instructions given Sharlette Dense, RN

## 2020-02-13 NOTE — Telephone Encounter (Signed)
Abnormal 1 Hr=>needs 3 Hr Gtt ASAP; attempted to call-no answer, left voicemail message Sharlette Dense, RN

## 2020-02-14 LAB — URINE CULTURE

## 2020-02-15 ENCOUNTER — Other Ambulatory Visit: Payer: Self-pay

## 2020-02-15 ENCOUNTER — Other Ambulatory Visit: Payer: Medicaid Other

## 2020-02-15 DIAGNOSIS — Z3483 Encounter for supervision of other normal pregnancy, third trimester: Secondary | ICD-10-CM

## 2020-02-15 DIAGNOSIS — O99810 Abnormal glucose complicating pregnancy: Secondary | ICD-10-CM

## 2020-02-15 NOTE — Progress Notes (Signed)
Pt to clinic for 3 hr GTT. Pt states the last time she had anything to eat or drink was between 8:00 - 9:00 pm last night. Pt given instructions for 3 hr GTT and pt knows when to return to lab.

## 2020-02-16 ENCOUNTER — Encounter: Payer: Self-pay | Admitting: Advanced Practice Midwife

## 2020-02-16 DIAGNOSIS — R825 Elevated urine levels of drugs, medicaments and biological substances: Secondary | ICD-10-CM | POA: Insufficient documentation

## 2020-02-16 LAB — GLUCOSE TOLERANCE TEST, 6 HOUR
Glucose, 1 Hour GTT: 76 mg/dL (ref 65–199)
Glucose, 2 hour: 92 mg/dL (ref 65–139)
Glucose, 3 hour: 82 mg/dL (ref 65–109)
Glucose, GTT - Fasting: 80 mg/dL (ref 65–99)

## 2020-02-16 LAB — CANNABINOID CONFIRMATION, UR
CANNABINOIDS: POSITIVE — AB
Carboxy THC GC/MS Conf: 97 ng/mL

## 2020-02-16 LAB — 789231 7+OXYCODONE-BUND
Amphetamines, Urine: NEGATIVE ng/mL
BENZODIAZ UR QL: NEGATIVE ng/mL
Barbiturate screen, urine: NEGATIVE ng/mL
Cocaine (Metab.): NEGATIVE ng/mL
OPIATE SCREEN URINE: NEGATIVE ng/mL
Oxycodone/Oxymorphone, Urine: NEGATIVE ng/mL
PCP Quant, Ur: NEGATIVE ng/mL

## 2020-02-27 ENCOUNTER — Other Ambulatory Visit: Payer: Self-pay

## 2020-02-27 ENCOUNTER — Ambulatory Visit: Payer: Medicaid Other | Admitting: Family Medicine

## 2020-02-27 ENCOUNTER — Encounter: Payer: Self-pay | Admitting: Family Medicine

## 2020-02-27 VITALS — BP 99/63 | HR 84 | Temp 97.9°F | Wt 239.4 lb

## 2020-02-27 DIAGNOSIS — O99210 Obesity complicating pregnancy, unspecified trimester: Secondary | ICD-10-CM

## 2020-02-27 DIAGNOSIS — O09893 Supervision of other high risk pregnancies, third trimester: Secondary | ICD-10-CM

## 2020-02-27 DIAGNOSIS — Z3480 Encounter for supervision of other normal pregnancy, unspecified trimester: Secondary | ICD-10-CM

## 2020-02-27 DIAGNOSIS — R825 Elevated urine levels of drugs, medicaments and biological substances: Secondary | ICD-10-CM

## 2020-02-27 DIAGNOSIS — Z9119 Patient's noncompliance with other medical treatment and regimen: Secondary | ICD-10-CM

## 2020-02-27 LAB — URINALYSIS
Bilirubin, UA: NEGATIVE
Glucose, UA: NEGATIVE
Ketones, UA: NEGATIVE
Nitrite, UA: NEGATIVE
Protein,UA: NEGATIVE
Specific Gravity, UA: 1.025 (ref 1.005–1.030)
Urobilinogen, Ur: 1 mg/dL (ref 0.2–1.0)
pH, UA: 7 (ref 5.0–7.5)

## 2020-02-27 NOTE — Progress Notes (Signed)
°  PRENATAL VISIT NOTE  Subjective:  Gabrielle Howell is a 24 y.o. G3P2002 at [redacted]w[redacted]d being seen today for ongoing prenatal care.  She is currently monitored for the following issues for this high-risk pregnancy and has Obesity in pregnancy; BMI=36.5; Supervision of other normal pregnancy, antepartum; Late prenatal care, antepartum; Pregnancy complicated by noncompliance in third trimester, antepartum; Patient noncompliance--no prenatal care x 8 weeks; Abnormal glucose tolerance test in pregnancy, antepartum; and Positive urine drug screen 02/12/20 +MJ on their problem list.  Patient reports backache on/off for a few months, hurts w/walking.  Contractions: Not present. Vag. Bleeding: None.  Movement: Present. Denies leaking of fluid/ROM.   The following portions of the patient's history were reviewed and updated as appropriate: allergies, current medications, past family history, past medical history, past social history, past surgical history and problem list. Problem list updated.  Objective:   Vitals:   02/27/20 1359  BP: 99/63  Pulse: 84  Temp: 97.9 F (36.6 C)  Weight: 239 lb 6.4 oz (108.6 kg)    Fetal Status: Fetal Heart Rate (bpm): 140 Fundal Height: 36 cm Movement: Present     General:  Alert, oriented and cooperative. Patient is in no acute distress.  Skin: Skin is warm and dry. No rash noted.   Cardiovascular: Normal heart rate noted  Respiratory: Normal respiratory effort, no problems with respiration noted  Abdomen: Soft, gravid, appropriate for gestational age.  Pain/Pressure: Absent     Pelvic: Cervical exam deferred        Extremities: Normal range of motion.  Edema: None  Mental Status: Normal mood and affect. Normal behavior. Normal judgment and thought content.   Assessment and Plan:  Pregnancy: G3P2002 at [redacted]w[redacted]d   1. Supervision of other normal pregnancy, antepartum -Up to date. Rechecking UA today d/t 1+ protein last visit = no protein today. -Discussed back  pain relief strategies including rest, baths, belly band, exercises and tylenol. Handout with exercises given. - Urinalysis (Urine Dip)  2. Obesity in pregnancy; BMI=36.5 41 lb 6.4 oz (18.8 kg) -Excess weight gain. Encouraged daily walking once back pain improves. Encouraged regular meals  3. Positive urine drug screen 02/12/20 +MJ -Pt denies marijuana use during pregnancy. Discussed positive UDS last visit, she attributes to 2nd hand smoke. Plan of Safe Care pamphlet discussed, given. Advised to avoid all substances during pregnancy. She declines beh health.  4. Pregnancy complicated by noncompliance in third trimester, antepartum -Encouraged adherence to q weekly visits until delivery.    Preterm labor symptoms and general obstetric precautions including but not limited to vaginal bleeding, contractions, leaking of fluid and fetal movement were reviewed in detail with the patient. Please refer to After Visit Summary for other counseling recommendations.  Return in about 1 week (around 03/05/2020) for routine prenatal care.  Future Appointments  Date Time Provider Department Center  03/05/2020  3:20 PM [facility] PROVIDER [facility] None    Ann Held, PA-C

## 2020-02-27 NOTE — Progress Notes (Signed)
HIV results abstracted. Syair Fricker, RN  

## 2020-02-27 NOTE — Progress Notes (Signed)
Here today for 35.2 week [facility] RV. Taking PNV and ASA QD. Denies ED/hospital visits since last RV. Needs UA today. Tawny Hopping, RN

## 2020-03-05 ENCOUNTER — Other Ambulatory Visit: Payer: Self-pay

## 2020-03-05 ENCOUNTER — Ambulatory Visit: Payer: Medicaid Other | Admitting: Family Medicine

## 2020-03-05 VITALS — BP 110/67 | HR 82 | Temp 98.5°F | Wt 242.5 lb

## 2020-03-05 DIAGNOSIS — Z9119 Patient's noncompliance with other medical treatment and regimen: Secondary | ICD-10-CM

## 2020-03-05 DIAGNOSIS — O99810 Abnormal glucose complicating pregnancy: Secondary | ICD-10-CM

## 2020-03-05 DIAGNOSIS — Z3480 Encounter for supervision of other normal pregnancy, unspecified trimester: Secondary | ICD-10-CM

## 2020-03-05 DIAGNOSIS — Z3483 Encounter for supervision of other normal pregnancy, third trimester: Secondary | ICD-10-CM

## 2020-03-05 DIAGNOSIS — O0930 Supervision of pregnancy with insufficient antenatal care, unspecified trimester: Secondary | ICD-10-CM

## 2020-03-05 DIAGNOSIS — O09893 Supervision of other high risk pregnancies, third trimester: Secondary | ICD-10-CM

## 2020-03-05 DIAGNOSIS — O99210 Obesity complicating pregnancy, unspecified trimester: Secondary | ICD-10-CM

## 2020-03-05 NOTE — Progress Notes (Signed)
   PRENATAL VISIT NOTE  Subjective:  Gabrielle Howell is a 24 y.o. G3P2002 at [redacted]w[redacted]d being seen today for ongoing prenatal care.  She is currently monitored for the following issues for this high-risk pregnancy and has Obesity in pregnancy; BMI=36.5; Supervision of other normal pregnancy, antepartum; Late prenatal care, antepartum; Pregnancy complicated by noncompliance in third trimester, antepartum; Patient noncompliance--no prenatal care x 8 weeks; Abnormal glucose tolerance test in pregnancy, antepartum; and Positive urine drug screen 02/12/20 +MJ on their problem list.  Patient reports no complaints.  Contractions: Not present. Vag. Bleeding: None.  Movement: Present. Denies leaking of fluid/ROM.   The following portions of the patient's history were reviewed and updated as appropriate: allergies, current medications, past family history, past medical history, past social history, past surgical history and problem list. Problem list updated.  Objective:   Vitals:   03/05/20 1531  BP: 110/67  Pulse: 82  Temp: 98.5 F (36.9 C)  Weight: 242 lb 8 oz (110 kg)    Fetal Status: Fetal Heart Rate (bpm): 150 Fundal Height: 37 cm Movement: Present  Presentation: Vertex  General:  Alert, oriented and cooperative. Patient is in no acute distress.  Skin: Skin is warm and dry. No rash noted.   Cardiovascular: Normal heart rate noted  Respiratory: Normal respiratory effort, no problems with respiration noted  Abdomen: Soft, gravid, appropriate for gestational age.  Pain/Pressure: Absent     Pelvic: Cervical exam deferred        Extremities: Normal range of motion.  Edema: None  Mental Status: Normal mood and affect. Normal behavior. Normal judgment and thought content.   Assessment and Plan:  Pregnancy: G3P2002 at [redacted]w[redacted]d  1. Supervision of normal intrauterine pregnancy in multigravida, third trimester Up to date Reviewed labor precautions Reviewed she will be on weekly visits until  delivery - GBS Culture - Chlamydia/GC NAA, Confirmation  2. Abnormal glucose tolerance test in pregnancy, antepartum 3hr WNL  3. Pregnancy complicated by noncompliance in third trimester, antepartum Up to date currently  4. Late prenatal care, antepartum  5. Obesity in pregnancy TWG=44 lb 8 oz (20.2 kg) which is above goal for her weight She is taking ASA She was seen at [facility] but did not have nutrition referral   Preterm labor symptoms and general obstetric precautions including but not limited to vaginal bleeding, contractions, leaking of fluid and fetal movement were reviewed in detail with the patient. Please refer to After Visit Summary for other counseling recommendations.  Return in about 1 week (around 03/12/2020) for Routine prenatal care.  Future Appointments  Date Time Provider Department Center  03/12/2020  9:00 AM [facility] PROVIDER [facility] None    Federico Flake, MD

## 2020-03-05 NOTE — Progress Notes (Signed)
Self-collecting 36 week cultures. Kepler Mccabe, RN  

## 2020-03-07 ENCOUNTER — Encounter: Payer: Self-pay | Admitting: Physician Assistant

## 2020-03-07 LAB — CHLAMYDIA/GC NAA, CONFIRMATION
Chlamydia trachomatis, NAA: NEGATIVE
Neisseria gonorrhoeae, NAA: NEGATIVE

## 2020-03-07 NOTE — Progress Notes (Signed)
[facility] Department Maternity Care Conference  Maternity Care Conference Date: 03/07/20  Gabrielle Howell was identified by clinical staff to benefit from an interdisciplinary team approach to help improve pregnancy care.  The [facility] Maternity Care Conference includes the maternity clinic coordinator (RN), medical providers (MD/APP staff), Care Management -OBCM and Healthy Beginnings, Centering Pregnancy coordinator, Infant Mortality reduction Dietitian.  Nursing staff are also encouraged to participate. The group meets monthly to discuss patient care and coordinate services.   The patient's care care at the agency was reviewed in EMR and high risk factors evaluated in an interdisciplinary approach.    Value added interventions discussed at this care conference today were: - concerns for late care and lapses in care - verified patient is open to care management - obesity with excessive (44 lb) weight gain;  engagement with WIC - discuss reproductive life planning

## 2020-03-09 LAB — CULTURE, BETA STREP (GROUP B ONLY): Strep Gp B Culture: NEGATIVE

## 2020-03-12 ENCOUNTER — Ambulatory Visit: Payer: Medicaid Other | Admitting: Advanced Practice Midwife

## 2020-03-12 ENCOUNTER — Other Ambulatory Visit: Payer: Self-pay

## 2020-03-12 VITALS — BP 115/70 | HR 92 | Temp 97.3°F | Wt 241.2 lb

## 2020-03-12 DIAGNOSIS — O99210 Obesity complicating pregnancy, unspecified trimester: Secondary | ICD-10-CM

## 2020-03-12 DIAGNOSIS — O09893 Supervision of other high risk pregnancies, third trimester: Secondary | ICD-10-CM

## 2020-03-12 DIAGNOSIS — Z91199 Patient's noncompliance with other medical treatment and regimen due to unspecified reason: Secondary | ICD-10-CM

## 2020-03-12 DIAGNOSIS — Z9119 Patient's noncompliance with other medical treatment and regimen: Secondary | ICD-10-CM

## 2020-03-12 DIAGNOSIS — O99810 Abnormal glucose complicating pregnancy: Secondary | ICD-10-CM

## 2020-03-12 DIAGNOSIS — R825 Elevated urine levels of drugs, medicaments and biological substances: Secondary | ICD-10-CM

## 2020-03-12 DIAGNOSIS — O0930 Supervision of pregnancy with insufficient antenatal care, unspecified trimester: Secondary | ICD-10-CM

## 2020-03-12 DIAGNOSIS — Z3480 Encounter for supervision of other normal pregnancy, unspecified trimester: Secondary | ICD-10-CM

## 2020-03-12 LAB — URINALYSIS
Bilirubin, UA: NEGATIVE
Glucose, UA: NEGATIVE
Ketones, UA: NEGATIVE
Nitrite, UA: NEGATIVE
Protein,UA: NEGATIVE
Specific Gravity, UA: 1.015 (ref 1.005–1.030)
Urobilinogen, Ur: 0.2 mg/dL (ref 0.2–1.0)
pH, UA: 7 (ref 5.0–7.5)

## 2020-03-12 NOTE — Progress Notes (Signed)
Reports last aspirin taken "a couple of days ago." Taking PNV daily. Jossie Ng, RN  Per request of E. Sciora CNM, Ellison Carwin MSW - [facility] Manager office and cell phone numbers written down and given to client. Urine dip results reviewed by Ms. Sciora. [facility] [place] referral and Epic snapshot print out faxed with confirmation received. [place] appt scheduled for 03/13/2020 at 10:30 am with arrival time of 10:15 am. Appt reminder given to client and client states can attend appt. Jossie Ng, RN

## 2020-03-12 NOTE — Progress Notes (Signed)
   PRENATAL VISIT NOTE  Subjective:  Gabrielle Howell is a 24 y.o. G3P2002 at [redacted]w[redacted]d being seen today for ongoing prenatal care.  She is currently monitored for the following issues for this low-risk pregnancy and has Obesity in pregnancy; BMI=36.5; Supervision of other normal pregnancy, antepartum; Late prenatal care, antepartum; Pregnancy complicated by noncompliance in third trimester, antepartum; Patient noncompliance--no prenatal care x 8 weeks; Abnormal glucose tolerance test in pregnancy, antepartum; and Positive urine drug screen 02/12/20 +MJ on their problem list.  Patient reports no complaints.  Contractions: Not present. Vag. Bleeding: None.  Movement: Present. Denies leaking of fluid/ROM.  The following portions of the patient's history were reviewed and updated as appropriate: allergies, current medications, past family history, past medical history, past social history, past surgical history and problem list. Problem list updated.  Objective:   Vitals:   03/12/20 0848  BP: 115/70  Pulse: 92  Temp: (!) 97.3 F (36.3 C)  Weight: 241 lb 3.2 oz (109.4 kg)    Fetal Status: Fetal Heart Rate (bpm): 150 Fundal Height: 37 cm Movement: Present  Presentation: Vertex  General:  Alert, oriented and cooperative. Patient is in no acute distress.  Skin: Skin is warm and dry. No rash noted.   Cardiovascular: Normal heart rate noted  Respiratory: Normal respiratory effort, no problems with respiration noted  Abdomen: Soft, gravid, appropriate for gestational age.  Pain/Pressure: Absent     Pelvic: Cervical exam performed Dilation: 1 Effacement (%): 50 Station: -3  Extremities: Normal range of motion.  Edema: None  Mental Status: Normal mood and affect. Normal behavior. Normal judgment and thought content.   Assessment and Plan:  Pregnancy: G3P2002 at [redacted]w[redacted]d  1. Abnormal glucose tolerance test in pregnancy, antepartum 3 hr GTT=wnl  2. Pregnancy complicated by noncompliance in third  trimester, antepartum Pt with flat affect; 1 lb wt loss in 1 week--pt states poor appetite and some nausea without vomiting.  Encouraged to eat high protein snacks q 2 hours No fundal height growth since 03/05/20 with 1 lb wt loss--u/s ordered for  AFI, EFW Not working.  Living with her 2 kids 4 and 1 yo Has car seat.  Knows when to go to L&D Pt requesting SVE today - Urinalysis (Urine Dip)  3. Late prenatal care  4. Obesity in pregnancy 43 lb 3.2 oz (19.6 kg) Not taking ASA 81 mg anymore   5. Positive urine drug screen 02/12/20 +MJ States last MJ 01/2020; agrees to UDS today - [id number] Drug Screen   Preterm labor symptoms and general obstetric precautions including but not limited to vaginal bleeding, contractions, leaking of fluid and fetal movement were reviewed in detail with the patient. Please refer to After Visit Summary for other counseling recommendations.  Return in about 1 week (around 03/19/2020).  No future appointments.  Alberteen Spindle, CNM

## 2020-03-18 ENCOUNTER — Encounter: Payer: Self-pay | Admitting: Family Medicine

## 2020-03-18 DIAGNOSIS — O26849 Uterine size-date discrepancy, unspecified trimester: Secondary | ICD-10-CM | POA: Insufficient documentation

## 2020-03-19 ENCOUNTER — Ambulatory Visit: Payer: Medicaid Other

## 2020-03-19 LAB — 789231 7+OXYCODONE-BUND
Amphetamines, Urine: NEGATIVE ng/mL
BENZODIAZ UR QL: NEGATIVE ng/mL
Barbiturate screen, urine: NEGATIVE ng/mL
Cocaine (Metab.): NEGATIVE ng/mL
OPIATE SCREEN URINE: NEGATIVE ng/mL
Oxycodone/Oxymorphone, Urine: NEGATIVE ng/mL
PCP Quant, Ur: NEGATIVE ng/mL

## 2020-03-19 LAB — CANNABINOID CONFIRMATION, UR
CANNABINOIDS: POSITIVE — AB
Carboxy THC GC/MS Conf: 32 ng/mL

## 2020-03-20 ENCOUNTER — Telehealth: Payer: Self-pay

## 2020-03-20 NOTE — Telephone Encounter (Signed)
Attempted to call to reschedule appt-left voicemail message Ralonda Tartt, RN  

## 2020-03-21 ENCOUNTER — Inpatient Hospital Stay
Admission: RE | Admit: 2020-03-21 | Discharge: 2020-03-23 | DRG: 806 | Disposition: A | Discharge status: Home / Self Care | Payer: Medicaid Other | Attending: Obstetrics and Gynecology | Admitting: Obstetrics and Gynecology

## 2020-03-21 ENCOUNTER — Other Ambulatory Visit: Payer: Self-pay

## 2020-03-21 ENCOUNTER — Encounter: Payer: Self-pay | Admitting: Obstetrics and Gynecology

## 2020-03-21 DIAGNOSIS — D62 Acute posthemorrhagic anemia: Secondary | ICD-10-CM | POA: Diagnosis not present

## 2020-03-21 DIAGNOSIS — O99214 Obesity complicating childbirth: Secondary | ICD-10-CM | POA: Diagnosis present

## 2020-03-21 DIAGNOSIS — Z3A38 38 weeks gestation of pregnancy: Secondary | ICD-10-CM | POA: Diagnosis not present

## 2020-03-21 DIAGNOSIS — O9081 Anemia of the puerperium: Secondary | ICD-10-CM | POA: Diagnosis not present

## 2020-03-21 DIAGNOSIS — O09893 Supervision of other high risk pregnancies, third trimester: Secondary | ICD-10-CM | POA: Diagnosis not present

## 2020-03-21 DIAGNOSIS — O26893 Other specified pregnancy related conditions, third trimester: Secondary | ICD-10-CM | POA: Diagnosis present

## 2020-03-21 DIAGNOSIS — Z20822 Contact with and (suspected) exposure to covid-19: Secondary | ICD-10-CM | POA: Diagnosis present

## 2020-03-21 LAB — URINE DRUG SCREEN, QUALITATIVE (ARMC ONLY)
Amphetamines, Ur Screen: NOT DETECTED
Barbiturates, Ur Screen: NOT DETECTED
Benzodiazepine, Ur Scrn: NOT DETECTED
Cannabinoid 50 Ng, Ur ~~LOC~~: POSITIVE — AB
Cocaine Metabolite,Ur ~~LOC~~: NOT DETECTED
MDMA (Ecstasy)Ur Screen: NOT DETECTED
Methadone Scn, Ur: NOT DETECTED
Opiate, Ur Screen: NOT DETECTED
Phencyclidine (PCP) Ur S: NOT DETECTED
Tricyclic, Ur Screen: NOT DETECTED

## 2020-03-21 LAB — CBC
HCT: 36.4 % (ref 36.0–46.0)
Hemoglobin: 12.4 g/dL (ref 12.0–15.0)
MCH: 31.1 pg (ref 26.0–34.0)
MCHC: 34.1 g/dL (ref 30.0–36.0)
MCV: 91.2 fL (ref 80.0–100.0)
Platelets: 214 10*3/uL (ref 150–400)
RBC: 3.99 MIL/uL (ref 3.87–5.11)
RDW: 12.7 % (ref 11.5–15.5)
WBC: 9.7 10*3/uL (ref 4.0–10.5)
nRBC: 0 % (ref 0.0–0.2)

## 2020-03-21 MED ORDER — ACETAMINOPHEN 325 MG PO TABS: 650.0000 mg | ORAL_TABLET | ORAL | Status: DC | PRN

## 2020-03-21 MED ORDER — LACTATED RINGERS IV SOLN: INTRAVENOUS | Status: DC

## 2020-03-21 MED ORDER — LACTATED RINGERS IV SOLN: 500.0000 mL | INTRAVENOUS | Status: DC | PRN

## 2020-03-21 MED ORDER — FENTANYL CITRATE (PF) 100 MCG/2ML IJ SOLN
50.0000 ug | INTRAMUSCULAR | Status: DC | PRN
  Administered 2020-03-21: 100 ug via INTRAVENOUS
  Filled 2020-03-21: qty 2

## 2020-03-21 MED ORDER — LIDOCAINE HCL (PF) 1 % IJ SOLN
30.0000 mL | INTRAMUSCULAR | Status: DC | PRN
  Filled 2020-03-21: qty 30

## 2020-03-21 MED ORDER — ONDANSETRON HCL 4 MG/2ML IJ SOLN: 4.0000 mg | Freq: Four times a day (QID) | INTRAMUSCULAR | Status: DC | PRN

## 2020-03-21 MED ORDER — MISOPROSTOL 200 MCG PO TABS
ORAL_TABLET | ORAL | Status: AC
  Filled 2020-03-21: qty 4

## 2020-03-21 MED ORDER — AMMONIA AROMATIC IN INHA
RESPIRATORY_TRACT | Status: AC
  Filled 2020-03-21: qty 10

## 2020-03-21 MED ORDER — OXYCODONE-ACETAMINOPHEN 5-325 MG PO TABS: 1.0000 | ORAL_TABLET | ORAL | Status: DC | PRN

## 2020-03-21 MED ORDER — OXYCODONE-ACETAMINOPHEN 5-325 MG PO TABS: 2.0000 | ORAL_TABLET | ORAL | Status: DC | PRN

## 2020-03-21 MED ORDER — SOD CITRATE-CITRIC ACID 500-334 MG/5ML PO SOLN: 30.0000 mL | ORAL | Status: DC | PRN

## 2020-03-21 MED ORDER — OXYTOCIN BOLUS FROM INFUSION: 333.0000 mL | Freq: Once | INTRAVENOUS | Status: DC

## 2020-03-21 MED ORDER — OXYTOCIN-SODIUM CHLORIDE 30-0.9 UT/500ML-% IV SOLN
2.5000 [IU]/h | INTRAVENOUS | Status: DC
  Filled 2020-03-21: qty 1000

## 2020-03-21 NOTE — OB Triage Note (Signed)
Pt is 24 yo G3P2 at 38.4wks presenting with regular UCs since 1900. UCs approx 3 min and increasing in intensity. Pt denies any medical history. No acute distress

## 2020-03-21 NOTE — H&P (Signed)
OB History & Physical   History of Present Illness:  Chief Complaint:   HPI:  Gabrielle Howell is a 24 y.o. G26P2002 female at [redacted]w[redacted]d dated by 11 week u/s.  She presents to L&D for contractions  She reports:  -active fetal movement -no leakage of fluid -no vaginal bleeding -onset of contractions at 1900 currently every 4 minutes  Pregnancy Issues: 1. Obesity 2. Late prenatal care 3. Non-compliance - no prenatal care x 8 weeks 4. Positive UDS 02/12/20 - MJ   Maternal Medical History:   Past Medical History:  Diagnosis Date  . Asthma     Past Surgical History:  Procedure Laterality Date  . Denies surgical history    . NO PAST SURGERIES      No Known Allergies  Prior to Admission medications   Medication Sig Start Date End Date Taking? Authorizing Provider  aspirin EC 81 MG tablet Take 81 mg by mouth daily. Patient not taking: Reported on 03/12/2020    [provider]  ondansetron (ZOFRAN ODT) 4 MG disintegrating tablet Take 1 tablet (4 mg total) by mouth every 6 (six) hours as needed for nausea. Patient not taking: Reported on 02/12/2020 12/14/19   Federico Flake, MD  Prenatal Vit-Fe Fumarate-FA (PREPLUS) 27-1 MG TABS Take 1 tablet by mouth daily while pregnant or breastfeeding. 11/17/19   Federico Flake, MD     Prenatal care site: [facility]   Social History: She  reports that she has never smoked. She has never used smokeless tobacco. She reports previous alcohol use. She reports current drug use. Drug: Marijuana.  Family History: family history includes Cerebral palsy in her half-sister; Seizures in her sister; Thyroid disease in her mother.   Review of Systems: A full review of systems was performed and negative except as noted in the HPI.    Physical Exam:  Vital Signs: BP 111/78   Pulse 100   Temp 98.2 F (36.8 C)   Resp 18   Ht 5\' 7"  (1.702 m)   Wt 108.9 kg   LMP 07/25/2019 (Approximate)   BMI 37.59 kg/m    General:   alert and cooperative  Skin:  normal  Neurologic:    Alert & oriented x 3  Lungs:   nl effort  Heart:   regular rate and rhythm  Abdomen:  non-tender, soft between UCs, gravid  Pelvis:  Exam deferred.  Extremities: : non-tender     Pertinent Results:  Prenatal Labs: Blood type/Rh A pos  Antibody screen positive for Anti-Lewis a (not a cause of HDN)  Rubella Immune  Varicella Immune  RPR NR  HBsAg Neg  HIV NR  GC neg  Chlamydia neg  Genetic screening Not done  1 hour GTT 140  3 hour GTT WNL  GBS neg   FHT: FHR: 145 bpm, variability: moderate,  accelerations:  Present,  decelerations:  Absent Category/reactivity:  Category I TOCO: regular, every 4 minutes SVE: Dilation: 4.5 / Effacement (%): 80 / Station: -1     Assessment:  Gabrielle Howell is a 24 y.o. G100P2002 female at [redacted]w[redacted]d with uterine contractions.   Plan:  1. Admit to Labor & Delivery; consents reviewed and obtained  2. Fetal Well being  - Fetal Tracing: Cat I - GBS Neg - Presentation: Vtx confirmed by SVE   3. Routine OB: - Prenatal labs reviewed, as above - Rh pos - CBC & T&S on admit - Clear fluids, IVF  4. Monitoring of Labor -  Contractions external toco in place -  Pelvis proven to 3400g -  Plan for continuous fetal monitoring  -  Maternal pain control as desired: IVPM, regional anesthesia - Anticipate vaginal delivery  5. Post Partum Planning: - Infant feeding: Wants to try breastfeeding, and formula - Contraception: Depo - Desires circ - Flu declined on 11/16/19 - Tdap given 02/12/20  Linda Hedges, CNM 03/21/2020 11:01 PM

## 2020-03-22 ENCOUNTER — Encounter: Payer: Self-pay | Admitting: Obstetrics and Gynecology

## 2020-03-22 DIAGNOSIS — O09893 Supervision of other high risk pregnancies, third trimester: Secondary | ICD-10-CM

## 2020-03-22 LAB — CBC
HCT: 32.1 % — ABNORMAL LOW (ref 36.0–46.0)
Hemoglobin: 11.3 g/dL — ABNORMAL LOW (ref 12.0–15.0)
MCH: 31.7 pg (ref 26.0–34.0)
MCHC: 35.2 g/dL (ref 30.0–36.0)
MCV: 90.2 fL (ref 80.0–100.0)
Platelets: 222 10*3/uL (ref 150–400)
RBC: 3.56 MIL/uL — ABNORMAL LOW (ref 3.87–5.11)
RDW: 12.6 % (ref 11.5–15.5)
WBC: 12.1 10*3/uL — ABNORMAL HIGH (ref 4.0–10.5)
nRBC: 0 % (ref 0.0–0.2)

## 2020-03-22 LAB — RPR: RPR Ser Ql: NONREACTIVE

## 2020-03-22 LAB — TYPE AND SCREEN
ABO/RH(D): A POS
Antibody Screen: NEGATIVE

## 2020-03-22 LAB — SARS CORONAVIRUS 2 BY RT PCR (HOSPITAL ORDER, PERFORMED IN ~~LOC~~ HOSPITAL LAB): SARS Coronavirus 2: NEGATIVE

## 2020-03-22 MED ORDER — TETANUS-DIPHTH-ACELL PERTUSSIS 5-2.5-18.5 LF-MCG/0.5 IM SUSP: 0.5000 mL | Freq: Once | INTRAMUSCULAR | Status: DC

## 2020-03-22 MED ORDER — IBUPROFEN 600 MG PO TABS
600.0000 mg | ORAL_TABLET | Freq: Four times a day (QID) | ORAL | Status: DC
  Administered 2020-03-22 – 2020-03-23 (×7): 600 mg via ORAL
  Filled 2020-03-22 (×7): qty 1

## 2020-03-22 MED ORDER — PRENATAL MULTIVITAMIN CH
1.0000 | ORAL_TABLET | Freq: Every day | ORAL | Status: DC
  Administered 2020-03-22 – 2020-03-23 (×2): 1 via ORAL
  Filled 2020-03-22 (×2): qty 1

## 2020-03-22 MED ORDER — OXYCODONE HCL 5 MG PO TABS: 5.0000 mg | ORAL_TABLET | ORAL | Status: DC | PRN

## 2020-03-22 MED ORDER — AMMONIA AROMATIC IN INHA
RESPIRATORY_TRACT | Status: AC
  Filled 2020-03-22: qty 10

## 2020-03-22 MED ORDER — ONDANSETRON HCL 4 MG/2ML IJ SOLN: 4.0000 mg | INTRAMUSCULAR | Status: DC | PRN

## 2020-03-22 MED ORDER — WITCH HAZEL-GLYCERIN EX PADS: 1.0000 "application " | MEDICATED_PAD | CUTANEOUS | Status: DC | PRN

## 2020-03-22 MED ORDER — OXYTOCIN 10 UNIT/ML IJ SOLN
INTRAMUSCULAR | Status: AC
  Administered 2020-03-22: 10 [IU] via INTRAMUSCULAR
  Filled 2020-03-22: qty 2

## 2020-03-22 MED ORDER — COCONUT OIL OIL
1.0000 "application " | TOPICAL_OIL | Status: DC | PRN
  Administered 2020-03-22: 1 via TOPICAL
  Filled 2020-03-22: qty 120

## 2020-03-22 MED ORDER — ACETAMINOPHEN 325 MG PO TABS
650.0000 mg | ORAL_TABLET | ORAL | Status: DC | PRN
  Administered 2020-03-22: 650 mg via ORAL
  Filled 2020-03-22: qty 2

## 2020-03-22 MED ORDER — FERROUS SULFATE 325 (65 FE) MG PO TABS
325.0000 mg | ORAL_TABLET | Freq: Two times a day (BID) | ORAL | Status: DC
  Administered 2020-03-22 – 2020-03-23 (×4): 325 mg via ORAL
  Filled 2020-03-22 (×4): qty 1

## 2020-03-22 MED ORDER — DIBUCAINE (PERIANAL) 1 % EX OINT: 1.0000 "application " | TOPICAL_OINTMENT | CUTANEOUS | Status: DC | PRN

## 2020-03-22 MED ORDER — MISOPROSTOL 200 MCG PO TABS
ORAL_TABLET | ORAL | Status: AC
  Filled 2020-03-22: qty 4

## 2020-03-22 MED ORDER — OXYCODONE HCL 5 MG PO TABS: 10.0000 mg | ORAL_TABLET | ORAL | Status: DC | PRN

## 2020-03-22 MED ORDER — DIPHENHYDRAMINE HCL 25 MG PO CAPS: 25.0000 mg | ORAL_CAPSULE | Freq: Four times a day (QID) | ORAL | Status: DC | PRN

## 2020-03-22 MED ORDER — BENZOCAINE-MENTHOL 20-0.5 % EX AERO: 1.0000 "application " | INHALATION_SPRAY | CUTANEOUS | Status: DC | PRN

## 2020-03-22 MED ORDER — SIMETHICONE 80 MG PO CHEW: 80.0000 mg | CHEWABLE_TABLET | ORAL | Status: DC | PRN

## 2020-03-22 MED ORDER — DOCUSATE SODIUM 100 MG PO CAPS
100.0000 mg | ORAL_CAPSULE | Freq: Two times a day (BID) | ORAL | Status: DC
  Administered 2020-03-22 – 2020-03-23 (×2): 100 mg via ORAL
  Filled 2020-03-22 (×2): qty 1

## 2020-03-22 MED ORDER — ONDANSETRON HCL 4 MG PO TABS: 4.0000 mg | ORAL_TABLET | ORAL | Status: DC | PRN

## 2020-03-22 NOTE — Lactation Note (Signed)
This note was copied from a baby's chart. Lactation Consultation Note  Patient Name: Gabrielle Howell [id number] Date: 03/22/2020 Reason for consult: Initial assessment;NICU baby   Maternal Data    Feeding Feeding Type: Bottle Fed - Formula Nipple Type: Slow - flow  LATCH Score                   Interventions Interventions: DEBP  Lactation Tools Discussed/Used Pump Review: Setup, frequency, and cleaning;Milk Storage Initiated by::  Phill Myron) Date initiated:: 03/22/20   Consult Status  Mom can obtain a pump through [facility] if needed. DEBP introduced with 73mm flange. May change to 83mm next time for better fit. Pumped for 15 minutes. No colostrum output but mom encouraged to pump every 3 hours for 10-15 minutes. Noted significant cramping during pump session.     Elmore Guise Jeff Frieden 03/22/2020, 11:40 AM

## 2020-03-22 NOTE — Discharge Summary (Addendum)
Obstetrical Discharge Summary  Patient Name: Gabrielle Howell DOB: 16-Mar-1996 MRN: [id number]  Date of Admission: 03/21/2020 Date of Delivery: 03/22/20 Delivered by: Haroldine Laws Date of Discharge: 03/23/2020  Primary OB: [facility]  [id number]'D last menstrual period was 07/25/2019 (approximate). EDC Estimated Date of Delivery: 03/31/20 Gestational Age at Delivery: [redacted]w[redacted]d   Antepartum complications:  1. Obesity 2. Late prenatal care 3. Non-compliance - no prenatal care x 8 weeks 4. Positive UDS 02/12/20 - MJ Admitting Diagnosis: Normal Labor Secondary Diagnosis: Patient Active Problem List   Diagnosis Date Noted   NSVD (normal spontaneous vaginal delivery) 03/22/2020   Positive urine drug screen 02/12/20 +MJ; +MJ 03/12/20 02/16/2020   Patient noncompliance--no prenatal care x 8 weeks 02/12/2020   Pregnancy complicated by noncompliance in third trimester, antepartum 02/07/2020   Late prenatal care, antepartum 12/01/2019    Augmentation: N/A Complications: None  Intrapartum complications/course:  Delivery Type: spontaneous vaginal delivery Anesthesia: none Placenta: spontaneous Laceration: None Episiotomy: none Newborn Data: Live born female  Birth Weight: 5lbs 13oz [phone]) APGAR: 8/9  Newborn Delivery   Birth date/time: 03/22/2020 00:16:00 Delivery type: Vaginal, Spontaneous      24yo G4P1021 at 39+4wks presenting with contractions, SROM with clear fluid.  She progressed to complete and pushed over an intact perineum and delivered the fetal head, followed promptly by the shoulders. She was in control the whole time, and the baby placed on the maternal abdomen. Delayed cord clamping and the FOB cut his cord, while he was skin to skin. The placenta delivered spontaneously and intact. No laceration notedMom and baby tolerated the procedure well.   Postpartum Procedures: None  Post partum course:  Patient had an uncomplicated postpartum course.  By time of discharge on PPD#1,  her pain was controlled on oral pain medications; she had appropriate lochia and was ambulating, voiding without difficulty and tolerating regular diet.  She was deemed stable for discharge to home.    Discharge Physical Exam:  BP (!) 92/54 (BP Location: Left Arm)   Pulse 79   Temp 98.8 F (37.1 C) (Axillary)   Resp 20   Ht 5\' 7"  (1.702 m)   Wt 108.9 kg   LMP 07/25/2019 (Approximate)   SpO2 98%   Breastfeeding Unknown   BMI 37.59 kg/m   General: alert and no distress Pulm: normal respiratory effort Lochia: appropriate Abdomen: soft, NT Uterine Fundus: firm, below umbilicus, and remains non-tender Extremities: No evidence of DVT seen on physical exam. No lower extremity edema. Edinburgh10/29/2020 Postnatal Depression Scale Screening Tool 03/23/2020 03/22/2020 10/20/2018  I have been able to laugh and see the funny side of things. 0 (No Data) 0  I have looked forward with enjoyment to things. 0 - 1  I have blamed myself unnecessarily when things went wrong. 0 - 0  I have been anxious or worried for no good reason. 2 - 2  I have felt scared or panicky for no good reason. 0 - 0  Things have been getting on top of me. 0 - 0  I have been so unhappy that I have had difficulty sleeping. 0 - 0  I have felt sad or miserable. 0 - 0  I have been so unhappy that I have been crying. 0 - 0  The thought of harming myself has occurred to me. 0 - 0  Edinburgh Postnatal Depression Scale Total 2 - 3     Labs: CBC Latest Ref Rng & Units 03/23/2020 03/22/2020 03/21/2020  WBC 4.0 - 10.5 K/uL  7.7 12.1(H) 9.7  Hemoglobin 12.0 - 15.0 g/dL 11.2(L) 11.3(L) 12.4  Hematocrit 36 - 46 % 32.7(L) 32.1(L) 36.4  Platelets 150 - 400 K/uL 213 222 214   A POS Hemoglobin  Date Value Ref Range Status  03/23/2020 11.2 (L) 12.0 - 15.0 g/dL Final  11/16/2019 12.7 11.1 - 15.9 g/dL Final   HCT  Date Value Ref Range Status  03/23/2020 32.7 (L) 36 - 46 % Final   Hematocrit  Date Value Ref Range Status   11/16/2019 36.3 34.0 - 46.6 % Final    Disposition: stable, discharge to home Baby Feeding: breastmilk Baby Disposition: SCN for hypoglycemia, SGA   Contraception: Depo  Prenatal Labs:  Blood type/Rh A pos  Antibody screen positive for Anti-Lewis a (not a cause of HDN)  Rubella Immune  Varicella Immune  RPR NR  HBsAg Neg  HIV NR  GC neg  Chlamydia neg  Genetic screening Not done  1 hour GTT 140  3 hour GTT WNL  GBS neg    Rh Immune globulin given: n/a Rubella vaccine given: n/a Varicella vaccine given: n/a Tdap vaccine given in AP or PP setting: given 02/12/20 Flu vaccine given in AP or PP setting: Declined  Plan: Jones Skene was discharged to home in good condition. Follow-up appointment with delivering provider in 6 weeks.  Discharge Instructions: Per After Visit Summary. Activity: Advance as tolerated. Pelvic rest for 6 weeks.   Diet: Regular Discharge Medications: Allergies as of 03/23/2020   No Known Allergies      Medication List     STOP taking these medications    aspirin EC 81 MG tablet   ondansetron 4 MG disintegrating tablet Commonly known as: Zofran ODT       TAKE these medications    acetaminophen 325 MG tablet Commonly known as: Tylenol Take 2 tablets (650 mg total) by mouth every 4 (four) hours as needed (for pain scale < 4).   ibuprofen 600 MG tablet Commonly known as: ADVIL Take 1 tablet (600 mg total) by mouth every 6 (six) hours as needed for mild pain or moderate pain.   medroxyPROGESTERone 150 MG/ML injection Commonly known as: DEPO-PROVERA Inject 1 mL (150 mg total) into the muscle every 3 (three) months.   PrePLUS 27-1 MG Tabs Take 1 tablet by mouth daily while pregnant or breastfeeding.       Outpatient follow up:   Follow-up Information     [facility] Schedule an appointment as soon as possible for a visit in 6 week(s).   Contact information: [place]                Signed:  Drinda Butts, CNM Certified Nurse Midwife [facility]   03/23/2020 3:48 PM

## 2020-03-22 NOTE — Progress Notes (Signed)
Post Partum Day 0  Subjective: Doing well, no concerns. Ambulating without difficulty, pain managed with PO meds, tolerating regular diet, and voiding without difficulty.   No fever/chills, chest pain, shortness of breath, nausea/vomiting, or leg pain. No nipple or breast pain.   Objective: BP (!) 110/52 Comment: RN ABBY MOSS INFORMED OF BP  Pulse 76   Temp 98.9 F (37.2 C) (Axillary)   Resp 18   Ht 5\' 7"  (1.702 m)   Wt 108.9 kg   LMP 07/25/2019 (Approximate)   SpO2 98%   Breastfeeding Unknown   BMI 37.59 kg/m    Physical Exam:  General: alert, cooperative, appears stated age and fatigued Breasts: soft/nontender CV: RRR Pulm: nl effort, CTABL Abdomen: soft, non-tender, active bowel sounds Uterine Fundus: firm Lochia: appropriate DVT Evaluation: No evidence of DVT seen on physical exam. No cords or calf tenderness. No significant calf/ankle edema.  Recent Labs    03/21/20 2308 03/22/20 0650  HGB 12.4 11.3*  HCT 36.4 32.1*  WBC 9.7 12.1*  PLT 214 222    Assessment/Plan: 24 y.o. G3P3003 postpartum day # 0  -Continue routine postpartum care -Lactation consult PRN for breastfeeding -Baby in special care for blood sugar issues   Disposition: Continue inpatient postpartum care    LOS: 1 day   03/24/20, CNM 03/22/2020, 9:30 AM   ----- 03/24/2020 Certified Nurse Midwife [facility]

## 2020-03-23 LAB — CBC
HCT: 32.7 % — ABNORMAL LOW (ref 36.0–46.0)
Hemoglobin: 11.2 g/dL — ABNORMAL LOW (ref 12.0–15.0)
MCH: 31.3 pg (ref 26.0–34.0)
MCHC: 34.3 g/dL (ref 30.0–36.0)
MCV: 91.3 fL (ref 80.0–100.0)
Platelets: 213 10*3/uL (ref 150–400)
RBC: 3.58 MIL/uL — ABNORMAL LOW (ref 3.87–5.11)
RDW: 12.9 % (ref 11.5–15.5)
WBC: 7.7 10*3/uL (ref 4.0–10.5)
nRBC: 0 % (ref 0.0–0.2)

## 2020-03-23 MED ORDER — ACETAMINOPHEN 325 MG PO TABS: 650.0000 mg | ORAL_TABLET | ORAL | Status: DC | PRN

## 2020-03-23 MED ORDER — IBUPROFEN 600 MG PO TABS: 600.0000 mg | ORAL_TABLET | Freq: Four times a day (QID) | ORAL | 3 refills | Status: DC | PRN

## 2020-03-23 MED ORDER — MEDROXYPROGESTERONE ACETATE 150 MG/ML IM SUSP: 150.0000 mg | INTRAMUSCULAR | Status: DC

## 2020-03-23 MED ORDER — MEDROXYPROGESTERONE ACETATE 150 MG/ML IM SUSP
150.0000 mg | INTRAMUSCULAR | Status: AC | PRN
  Administered 2020-03-23: 150 mg via INTRAMUSCULAR
  Filled 2020-03-23: qty 1

## 2020-03-23 NOTE — TOC Initial Note (Addendum)
Transition of Care [facility]) - Initial/Assessment Note    Patient Details  Name: Gabrielle Howell MRN: [id number] Date of Birth: [birth year]  Transition of Care [facility]) CM/SW Contact:    Larwance Rote, LCSW Phone Number: 03/23/2020, 3:18 PM  Clinical Narrative:       The patient is a 24 year old female who tested positive for marijuana. Patient gave birth to a baby boy, Gabrielle Howell on 03/22/2020 (waiting for cord results).  Patient is the mother of two children. She reported that she is a single parent of two children 15 and 63-year-old. Father of her newborn child is Occupational psychologist. Patient reported that [name] will be living in the home with her and her two children. She reported that the home is prepared her newborn including car seat and crib.    Provided this patient with substance abuse counseling and provided her with community resources for substance abuse.      Patient Goals and CMS Choice       Expected Discharge Plan and Services                                                Prior Living Arrangements/Services                       Activities of Daily Living Home Assistive Devices/Equipment: None ADL Screening (condition at time of admission) Patient's cognitive ability adequate to safely complete daily activities?: Yes Is the patient deaf or have difficulty hearing?: No Does the patient have difficulty seeing, even when wearing glasses/contacts?: No Does the patient have difficulty concentrating, remembering, or making decisions?: No Patient able to express need for assistance with ADLs?: Yes Does the patient have difficulty dressing or bathing?: No Independently performs ADLs?: Yes (appropriate for developmental age) Does the patient have difficulty walking or climbing stairs?: No Weakness of Legs: None Weakness of Arms/Hands: None  Permission Sought/Granted                  Emotional Assessment               Admission diagnosis:  Normal labor [O80, Z37.9] Patient Active Problem List   Diagnosis Date Noted  . NSVD (normal spontaneous vaginal delivery) 03/22/2020  . Positive urine drug screen 02/12/20 +MJ; +MJ 03/12/20 02/16/2020  . Patient noncompliance--no prenatal care x 8 weeks 02/12/2020  . Pregnancy complicated by noncompliance in third trimester, antepartum 02/07/2020  . Late prenatal care, antepartum 12/01/2019   PCP:  Patient, No Pcp Per Pharmacy:   [facility] [address] ([address]), [place] - [address] [place]) [place] [address] Phone: [phone] Fax: [phone]     Social Determinants of Health (SDOH) Interventions    Readmission Risk Interventions No flowsheet data found.

## 2020-03-23 NOTE — Discharge Instructions (Signed)
Breast Pumping Tips Breast pumping is a way to get milk out of your breasts. You will then store the milk for your baby to use when you are away from home. There are three ways to pump. You can:  Use your hand to massage and squeeze your breast (hand expression).  Use a hand-held machine to manually pump your milk.  Use an electric machine to pump your milk. In the beginning you may not get much milk. After a few days your breasts should make more. Pumping can help you start making milk after your baby is born. Pumping helps you to keep making milk when you are away from your baby. When should I pump? You can start pumping soon after your baby is born. Follow these tips:  When you are with your baby: ? Pump after you breastfeed. ? Pump from the free breast while you breastfeed.  When you are away from your baby: ? Pump every 2-3 hours for 15 minutes. ? Pump both breasts at the same time if you can.  If your baby drinks formula, pump around the time your baby gets the formula.  If you drank alcohol, wait 2 hours before you pump.  If you are going to have surgery, ask your doctor when you should pump again. How do I get ready to pump? Take steps to relax. Try these things to help your milk come in:  Smell your baby's blanket or clothes.  Look at a picture or video of your baby.  Sit in a quiet, private space.  Massage your breast and nipple.  Place a cloth on your breast. The cloth should be warm and a little wet.  Play relaxing music.  Picture your milk flowing. What are some tips? General tips for pumping breast milk   Always wash your hands before pumping.  If you do not get much milk or if pumping hurts, try different pump settings or a different kind of pump.  Drink enough fluid so your pee (urine) is clear or pale yellow.  Wear clothing that opens in the front or is easy to take off.  Pump milk into a clean bottle or container.  Do not use anything that has  nicotine or tobacco. Examples are cigarettes and e-cigarettes. If you need help quitting, ask your doctor. Tips for storing breast milk   Store breast milk in a clean, BPA-free container. These include: ? A glass or plastic bottle. ? A milk storage bag.  Store only 2-4 ounces of breast milk in each container.  Swirl the breast milk in the container. Do not shake it.  Write down the date you pumped the milk on the container.  This is how long you can store breast milk: ? Room temperature: 6-8 hours. It is best to use the milk within 4 hours. ? Cooler with ice packs: 24 hours. ? Refrigerator: 5-8 days, if the milk is clean. It is best to use the milk within 3 days. ? Freezer: 9-12 months, if the milk is clean and stored away from the freezer door. It is best to use the milk within 6 months.  Put milk in the back of the refrigerator or freezer.  Thaw frozen milk using warm water. Do not use the microwave. Tips for choosing a breast pump When choosing a pump, keep the following things in mind:  Manual breast pumps do not need electricity. They cost less. They can be hard to use.  Electric breast pumps use electricity. They  are more expensive. They are easier to use. They collect more milk.  The suction cup (flange) should be the right size.  Before you buy the pump, check if your insurance will pay for it. Tips for caring for a breast pump  Check the manual that came with your pump for cleaning tips.  Clean the pump after you use it. To do this: ? Wipe down the electrical part. Use a dry cloth or paper towel. Do not put this part in water or in cleaning products. ? Wash the plastic parts with soap and warm water. Or use the dishwasher if the manual says it is safe. You do not need to clean the tubing unless it touched breast milk. ? Let all the parts air dry. Avoid drying them with a cloth or towel. ? When the parts are clean and dry, put the pump back together. Then store the  pump.  If there is water in the tubing when you want to pump: 1. Attach the tubing to the pump. 2. Turn on the pump. 3. Turn off the pump when the tube is dry.  Try not to touch the inside of pump parts. Summary  Pumping can help you start making milk after your baby is born. It lets you keep making milk when you are away from your baby.  When you are away from your baby, pump for about 15 minutes every 2-3 hours. Pump both breasts at the same time, if you can. This information is not intended to replace advice given to you by your health care provider. Make sure you discuss any questions you have with your health care provider. Document Revised: 01/04/2019 Document Reviewed: 10/19/2016 Elsevier Patient Education  2020 [employer] Postpartum Care After Vaginal Delivery This sheet gives you information about how to care for yourself from the time you deliver your baby to up to 6-12 weeks after delivery (postpartum period). Your health care provider may also give you more specific instructions. If you have problems or questions, contact your health care provider. Follow these instructions at home: Vaginal bleeding  It is normal to have vaginal bleeding (lochia) after delivery. Wear a sanitary pad for vaginal bleeding and discharge. ? During the first week after delivery, the amount and appearance of lochia is often similar to a menstrual period. ? Over the next few weeks, it will gradually decrease to a dry, yellow-brown discharge. ? For most women, lochia stops completely by 4-6 weeks after delivery. Vaginal bleeding can vary from woman to woman.  Change your sanitary pads frequently. Watch for any changes in your flow, such as: ? A sudden increase in volume. ? A change in color. ? Large blood clots.  If you pass a blood clot from your vagina, save it and call your health care provider to discuss. Do not flush blood clots down the toilet before talking with your health care  provider.  Do not use tampons or douches until your health care provider says this is safe.  If you are not breastfeeding, your period should return 6-8 weeks after delivery. If you are feeding your child breast milk only (exclusive breastfeeding), your period may not return until you stop breastfeeding. Perineal care  Keep the area between the vagina and the anus (perineum) clean and dry as told by your health care provider. Use medicated pads and pain-relieving sprays and creams as directed.  If you had a cut in the perineum (episiotomy) or a tear in the vagina, check the  area for signs of infection until you are healed. Check for: ? More redness, swelling, or pain. ? Fluid or blood coming from the cut or tear. ? Warmth. ? Pus or a bad smell.  You may be given a squirt bottle to use instead of wiping to clean the perineum area after you go to the bathroom. As you start healing, you may use the squirt bottle before wiping yourself. Make sure to wipe gently.  To relieve pain caused by an episiotomy, a tear in the vagina, or swollen veins in the anus (hemorrhoids), try taking a warm sitz bath 2-3 times a day. A sitz bath is a warm water bath that is taken while you are sitting down. The water should only come up to your hips and should cover your buttocks. Breast care  Within the first few days after delivery, your breasts may feel heavy, full, and uncomfortable (breast engorgement). Milk may also leak from your breasts. Your health care provider can suggest ways to help relieve the discomfort. Breast engorgement should go away within a few days.  If you are breastfeeding: ? Wear a bra that supports your breasts and fits you well. ? Keep your nipples clean and dry. Apply creams and ointments as told by your health care provider. ? You may need to use breast pads to absorb milk that leaks from your breasts. ? You may have uterine contractions every time you breastfeed for up to several weeks  after delivery. Uterine contractions help your uterus return to its normal size. ? If you have any problems with breastfeeding, work with your health care provider or Advertising copywriter.  If you are not breastfeeding: ? Avoid touching your breasts a lot. Doing this can make your breasts produce more milk. ? Wear a good-fitting bra and use cold packs to help with swelling. ? Do not squeeze out (express) milk. This causes you to make more milk. Intimacy and sexuality  Ask your health care provider when you can engage in sexual activity. This may depend on: ? Your risk of infection. ? How fast you are healing. ? Your comfort and desire to engage in sexual activity.  You are able to get pregnant after delivery, even if you have not had your period. If desired, talk with your health care provider about methods of birth control (contraception). Medicines  Take over-the-counter and prescription medicines only as told by your health care provider.  If you were prescribed an antibiotic medicine, take it as told by your health care provider. Do not stop taking the antibiotic even if you start to feel better. Activity  Gradually return to your normal activities as told by your health care provider. Ask your health care provider what activities are safe for you.  Rest as much as possible. Try to rest or take a nap while your baby is sleeping. Eating and drinking   Drink enough fluid to keep your urine pale yellow.  Eat high-fiber foods every day. These may help prevent or relieve constipation. High-fiber foods include: ? Whole grain cereals and breads. ? Brown rice. ? Beans. ? Fresh fruits and vegetables.  Do not try to lose weight quickly by cutting back on calories.  Take your prenatal vitamins until your postpartum checkup or until your health care provider tells you it is okay to stop. Lifestyle  Do not use any products that contain nicotine or tobacco, such as cigarettes and  e-cigarettes. If you need help quitting, ask your health care provider.  Do not drink alcohol, especially if you are breastfeeding. General instructions  Keep all follow-up visits for you and your baby as told by your health care provider. Most women visit their health care provider for a postpartum checkup within the first 3-6 weeks after delivery. Contact a health care provider if:  You feel unable to cope with the changes that your child brings to your life, and these feelings do not go away.  You feel unusually sad or worried.  Your breasts become red, painful, or hard.  You have a fever.  You have trouble holding urine or keeping urine from leaking.  You have little or no interest in activities you used to enjoy.  You have not breastfed at all and you have not had a menstrual period for 12 weeks after delivery.  You have stopped breastfeeding and you have not had a menstrual period for 12 weeks after you stopped breastfeeding.  You have questions about caring for yourself or your baby.  You pass a blood clot from your vagina. Get help right away if:  You have chest pain.  You have difficulty breathing.  You have sudden, severe leg pain.  You have severe pain or cramping in your lower abdomen.  You bleed from your vagina so much that you fill more than one sanitary pad in one hour. Bleeding should not be heavier than your heaviest period.  You develop a severe headache.  You faint.  You have blurred vision or spots in your vision.  You have bad-smelling vaginal discharge.  You have thoughts about hurting yourself or your baby. If you ever feel like you may hurt yourself or others, or have thoughts about taking your own life, get help right away. You can go to the nearest emergency department or call:  Your local emergency services (911 in the [place]  A suicide crisis helpline, such as the National Suicide Prevention Lifeline at (417)165-6134. This is open 24  hours a day. Summary  The period of time right after you deliver your newborn up to 6-12 weeks after delivery is called the postpartum period.  Gradually return to your normal activities as told by your health care provider.  Keep all follow-up visits for you and your baby as told by your health care provider. This information is not intended to replace advice given to you by your health care provider. Make sure you discuss any questions you have with your health care provider. Document Revised: 09/17/2017 Document Reviewed: 06/28/2017 Elsevier Patient Education  2020 [employer].   Postpartum Baby Blues The postpartum period begins right after the birth of a baby. During this time, there is often a lot of joy and excitement. It is also a time of many changes in the life of the parents. No matter how many times a mother gives birth, each child brings new challenges to the family, including different ways of relating to one another. It is common to have feelings of excitement along with confusing changes in moods, emotions, and thoughts. You may feel happy one minute and sad or stressed the next. These feelings of sadness usually happen in the period right after you have your baby, and they go away within a week or two. This is called the "baby blues." What are the causes? There is no known cause of baby blues. It is likely caused by a combination of factors. However, changes in hormone levels after childbirth are believed to trigger some of the symptoms. Other factors that  can play a role in these mood changes include:  Lack of sleep.  Stressful life events, such as poverty, caring for a loved one, or death of a loved one.  Genetics. What are the signs or symptoms? Symptoms of this condition include:  Brief changes in mood, such as going from extreme happiness to sadness.  Decreased concentration.  Difficulty sleeping.  Crying spells and tearfulness.  Loss of  appetite.  Irritability.  Anxiety. If the symptoms of baby blues last for more than 2 weeks or become more severe, you may have postpartum depression. How is this diagnosed? This condition is diagnosed based on an evaluation of your symptoms. There are no medical or lab tests that lead to a diagnosis, but there are various questionnaires that a health care provider may use to identify women with the baby blues or postpartum depression. How is this treated? Treatment is not needed for this condition. The baby blues usually go away on their own in 1-2 weeks. Social support is often all that is needed. You will be encouraged to get adequate sleep and rest. Follow these instructions at home: Lifestyle      Get as much rest as you can. Take a nap when the baby sleeps.  Exercise regularly as told by your health care provider. Some women find yoga and walking to be helpful.  Eat a balanced and nourishing diet. This includes plenty of fruits and vegetables, whole grains, and lean proteins.  Do little things that you enjoy. Have a cup of tea, take a bubble bath, read your favorite magazine, or listen to your favorite music.  Avoid alcohol.  Ask for help with household chores, cooking, grocery shopping, or running errands. Do not try to do everything yourself. Consider hiring a postpartum doula to help. This is a professional who specializes in providing support to new mothers.  Try not to make any major life changes during pregnancy or right after giving birth. This can add stress. General instructions  Talk to people close to you about how you are feeling. Get support from your partner, family members, friends, or other new moms. You may want to join a support group.  Find ways to cope with stress. This may include: ? Writing your thoughts and feelings in a journal. ? Spending time outside. ? Spending time with people who make you laugh.  Try to stay positive in how you think. Think  about the things you are grateful for.  Take over-the-counter and prescription medicines only as told by your health care provider.  Let your health care provider know if you have any concerns.  Keep all postpartum visits as told by your health care provider. This is important. Contact a health care provider if:  Your baby blues do not go away after 2 weeks. Get help right away if:  You have thoughts of taking your own life (suicidal thoughts).  You think you may harm the baby or other people.  You see or hear things that are not there (hallucinations). Summary  After giving birth, you may feel happy one minute and sad or stressed the next. Feelings of sadness that happen right after the baby is born and go away after a week or two are called the "baby blues."  You can manage the baby blues by getting enough rest, eating a healthy diet, exercising, spending time with supportive people, and finding ways to cope with stress.  If feelings of sadness and stress last longer than 2 weeks  or get in the way of caring for your baby, talk to your health care provider. This may mean you have postpartum depression. This information is not intended to replace advice given to you by your health care provider. Make sure you discuss any questions you have with your health care provider. Document Revised: 01/06/2019 Document Reviewed: 11/10/2016 Elsevier Patient Education  2020 [employer]  Medroxyprogesterone injection [Contraceptive] What is this medicine? MEDROXYPROGESTERONE (me DROX ee proe JES te rone) contraceptive injections prevent pregnancy. They provide effective birth control for 3 months. Depo-subQ Provera 104 is also used for treating pain related to endometriosis. This medicine may be used for other purposes; ask your health care provider or pharmacist if you have questions. COMMON BRAND NAME(S): Depo-Provera, Depo-subQ Provera 104 What should I tell my health care provider before I  take this medicine? They need to know if you have any of these conditions:  frequently drink alcohol  asthma  blood vessel disease or a history of a blood clot in the lungs or legs  bone disease such as osteoporosis  breast cancer  diabetes  eating disorder (anorexia nervosa or bulimia)  high blood pressure  HIV infection or AIDS  kidney disease  liver disease  mental depression  migraine  seizures (convulsions)  stroke  tobacco smoker  vaginal bleeding  an unusual or allergic reaction to medroxyprogesterone, other hormones, medicines, foods, dyes, or preservatives  pregnant or trying to get pregnant  breast-feeding How should I use this medicine? Depo-Provera Contraceptive injection is given into a muscle. Depo-subQ Provera 104 injection is given under the skin. These injections are given by a health care professional. You must not be pregnant before getting an injection. The injection is usually given during the first 5 days after the start of a menstrual period or 6 weeks after delivery of a baby. Talk to your pediatrician regarding the use of this medicine in children. Special care may be needed. These injections have been used in female children who have started having menstrual periods. Overdosage: If you think you have taken too much of this medicine contact a poison control center or emergency room at once. NOTE: This medicine is only for you. Do not share this medicine with others. What if I miss a dose? Try not to miss a dose. You must get an injection once every 3 months to maintain birth control. If you cannot keep an appointment, call and reschedule it. If you wait longer than 13 weeks between Depo-Provera contraceptive injections or longer than 14 weeks between Depo-subQ Provera 104 injections, you could get pregnant. Use another method for birth control if you miss your appointment. You may also need a pregnancy test before receiving another  injection. What may interact with this medicine? Do not take this medicine with any of the following medications:  bosentan This medicine may also interact with the following medications:  aminoglutethimide  antibiotics or medicines for infections, especially rifampin, rifabutin, rifapentine, and griseofulvin  aprepitant  barbiturate medicines such as phenobarbital or primidone  bexarotene  carbamazepine  medicines for seizures like ethotoin, felbamate, oxcarbazepine, phenytoin, topiramate  modafinil  St. John's wort This list may not describe all possible interactions. Give your health care provider a list of all the medicines, herbs, non-prescription drugs, or dietary supplements you use. Also tell them if you smoke, drink alcohol, or use illegal drugs. Some items may interact with your medicine. What should I watch for while using this medicine? This drug does not protect you  against HIV infection (AIDS) or other sexually transmitted diseases. Use of this product may cause you to lose calcium from your bones. Loss of calcium may cause weak bones (osteoporosis). Only use this product for more than 2 years if other forms of birth control are not right for you. The longer you use this product for birth control the more likely you will be at risk for weak bones. Ask your health care professional how you can keep strong bones. You may have a change in bleeding pattern or irregular periods. Many females stop having periods while taking this drug. If you have received your injections on time, your chance of being pregnant is very low. If you think you may be pregnant, see your health care professional as soon as possible. Tell your health care professional if you want to get pregnant within the next year. The effect of this medicine may last a long time after you get your last injection. What side effects may I notice from receiving this medicine? Side effects that you should report to  your doctor or health care professional as soon as possible:  allergic reactions like skin rash, itching or hives, swelling of the face, lips, or tongue  breast tenderness or discharge  breathing problems  changes in vision  depression  feeling faint or lightheaded, falls  fever  pain in the abdomen, chest, groin, or leg  problems with balance, talking, walking  unusually weak or tired  yellowing of the eyes or skin Side effects that usually do not require medical attention (report to your doctor or health care professional if they continue or are bothersome):  acne  fluid retention and swelling  headache  irregular periods, spotting, or absent periods  temporary pain, itching, or skin reaction at site where injected  weight gain This list may not describe all possible side effects. Call your doctor for medical advice about side effects. You may report side effects to FDA at 1-800-FDA-1088. Where should I keep my medicine? This does not apply. The injection will be given to you by a health care professional. NOTE: This sheet is a summary. It may not cover all possible information. If you have questions about this medicine, talk to your doctor, pharmacist, or health care provider.  2020 Elsevier/Gold Standard (2008-10-05 18:37:56)

## 2020-03-23 NOTE — Progress Notes (Signed)
Post Partum 1  Subjective: no complaints, up ad lib, voiding and tolerating PO  Doing well, no concerns. Ambulating without difficulty, pain managed with PO meds, tolerating regular diet, and voiding without difficulty.   No fever/chills, chest pain, shortness of breath, nausea/vomiting, or leg pain. No nipple or breast pain. No headache, visual changes, or RUQ/epigastric pain.  Objective: BP (!) 92/54 (BP Location: Left Arm)   Pulse 79   Temp 98.8 F (37.1 C) (Axillary)   Resp 20   Ht 5\' 7"  (1.702 m)   Wt 108.9 kg   LMP 07/25/2019 (Approximate)   SpO2 98%   Breastfeeding Unknown   BMI 37.59 kg/m    Physical Exam:  General: alert, cooperative and no distress Breasts: soft/nontender CV: RRR Pulm: nl effort, CTABL Abdomen: soft, non-tender, active bowel sounds Uterine Fundus: firm Perineum: minimal edema, intact Lochia: appropriate DVT Evaluation: No evidence of DVT seen on physical exam.  Recent Labs    03/22/20 0650 03/23/20 0829  HGB 11.3* 11.2*  HCT 32.1* 32.7*  WBC 12.1* 7.7  PLT 222 213    Assessment/Plan: 24 y.o. G3P3003 postpartum day # 1  -Continue routine postpartum care -Lactation consult PRN for breastfeeding - currently pumping and breastfeeding infant in SCN when able to   -Discussed contraceptive options including implant, IUDs hormonal and non-hormonal, injection, pills/ring/patch, condoms, and NFP. - Desires Depo prior to discharge.  Risks/benefits discussed.  -Acute blood loss anemia - hemodynamically stable and asymptomatic; continue PO ferrous sulfate BID with stool softeners  -Immunization status: all immunizations up to date -Discussed discharge home today versus tomorrow.  Unsure of when infant will discharge d/t hypoglycemia.  She has a 23 year old and 23 year old who are staying with family.  Reviewed that it is reasonable to discharge home today if she wanted to be near her other children and she would be able to come as much as she wanted to  the SCN.  It is also appropriate to stay for continued postpartum care until tomorrow.  She will think about her options and let 2 know what she would like to do.    Disposition: Continue inpatient postpartum care. Plan for discharge home tomorrow.     LOS: 2 days   [place], Gustavo Lah 03/23/2020, 8:59 AM   ----- 03/25/2020  Certified Nurse Midwife [facility]

## 2020-03-23 NOTE — Progress Notes (Signed)
Pt discharged.  Infant in SCN. Discharge instructions, prescriptions and follow up appointment given to and reviewed with pt. Pt verbalized understanding. Escorted out by staff.

## 2020-03-24 ENCOUNTER — Ambulatory Visit: Payer: Self-pay

## 2020-03-24 NOTE — Lactation Note (Signed)
This note was copied from a baby's chart. Lactation Consultation Note  Patient Name: Gabrielle Howell Date: 03/24/2020 Reason for consult: Follow-up assessment;Mother's request;NICU baby;Early term 37-38.6wks;Infant < 6lbs;Other (Comment) (Initial blood glucoses)  Mom wanting to breast feed.  When mom came in it was past feeding time and Gabrielle Howell had been given 30 ml via bottle.  Positioned mom in comfortable position in chair with pillow support.  When hand expressed, mom sprayed transitional milk.  Breasts were full.  Gabrielle Howell latched with minimal assistance and began strong, rhythmic sucking with audible swallows.  He continued to suck intermittently for 20 minutes.  Demonstrated how to massage breast and gently stimulate Gabrielle Howell to keep him actively sucking at the breast.  Less than 1 1/2 hours later, Gabrielle Howell was showing feeding cues.  Mom was willing to put him back to the breast.  He did latch and suck, but was more sleepy at this feeding.  Mom had been trying to use manual pump through the night.  Rented [employer] through [employer].  Referral faxed to [facility] for mom to get [facility] DEBP loaner.  Encouraged mom to breast feed Gabrielle Howell whenever she was here visiting and try and pump every 2 to 3 hours at home since mature milk is transitioning in to ensure a plentiful milk supply and to prevent engorgement.  Reviewed breast massage, hand expression, pump set up, pumping, collection, storage, cleaning, labeling, handling and transporting expressed breast milk.  Lactation number given and encouraged to call with any questions, concerns or assistance. Maternal Data Formula Feeding for Exclusion: No Has patient been taught Hand Expression?: Yes Does the patient have breastfeeding experience prior to this delivery?: Yes  Feeding Feeding Type: Breast Fed Nipple Type: Slow - flow  LATCH Score Latch: Repeated attempts needed to sustain latch, nipple held in mouth throughout feeding, stimulation needed to  elicit sucking reflex.  Audible Swallowing: Spontaneous and intermittent  Type of Nipple: Everted at rest and after stimulation  Comfort (Breast/Nipple): Filling, red/small blisters or bruises, mild/mod discomfort  Hold (Positioning): Assistance needed to correctly position infant at breast and maintain latch.  LATCH Score: 7  Interventions Interventions: Breast feeding basics reviewed;Assisted with latch;Skin to skin;Breast massage;Hand express;Reverse pressure;Breast compression;Adjust position;Support pillows;Position options  Lactation Tools Discussed/Used WIC Program: Yes Pump Review: Setup, frequency, and cleaning;Milk Storage;Other (comment) Initiated by:: L.Hubbard, PhD,RN,IBCLC Date initiated:: 03/22/20   Consult Status Consult Status: Follow-up Date: 03/24/20 Follow-up type: In-patient    Louis Meckel 03/24/2020, 4:02 PM

## 2020-03-26 ENCOUNTER — Ambulatory Visit: Payer: Medicaid Other

## 2020-05-20 ENCOUNTER — Ambulatory Visit: Payer: Medicaid Other

## 2020-05-22 ENCOUNTER — Telehealth: Payer: Self-pay

## 2020-05-22 NOTE — Telephone Encounter (Signed)
TC to patient to reschedule missed PP appointment. Female who answered the phone states patient is at work and should return in about 20 minutes. He states he will tell patient to call--number given.Burt Knack, RN

## 2020-05-27 NOTE — Telephone Encounter (Signed)
Phone call to pt. Left message on voicemail that RN with [facility] is calling to follow-up about post partum appt. Please call [phone] to talk to nurse or to reschedule appt.

## 2020-06-13 NOTE — Telephone Encounter (Signed)
TC to patient to reschedule missed PP appointment. LM with number to call.Burt Knack, RN

## 2020-06-17 NOTE — Telephone Encounter (Signed)
Phone call to pt. Left message on voicemail that RN with [facility] is calling about  rescheduling PP exam.  Can call  [phone] to schedule PP or if need assistance.

## 2020-06-21 ENCOUNTER — Other Ambulatory Visit: Payer: Self-pay

## 2020-06-21 ENCOUNTER — Ambulatory Visit: Payer: Medicaid Other | Admitting: Advanced Practice Midwife

## 2020-06-21 ENCOUNTER — Encounter: Payer: Self-pay | Admitting: Advanced Practice Midwife

## 2020-06-21 DIAGNOSIS — Z3009 Encounter for other general counseling and advice on contraception: Secondary | ICD-10-CM | POA: Diagnosis not present

## 2020-06-21 DIAGNOSIS — Z30013 Encounter for initial prescription of injectable contraceptive: Secondary | ICD-10-CM | POA: Diagnosis not present

## 2020-06-21 MED ORDER — MEDROXYPROGESTERONE ACETATE 150 MG/ML IM SUSP
150.0000 mg | Freq: Once | INTRAMUSCULAR | Status: AC
  Administered 2020-06-21: 150 mg via INTRAMUSCULAR

## 2020-06-21 NOTE — Progress Notes (Signed)
Pt will call for provider IUD insertion appt if she decides to get Mirena in future. Provider orders completed.

## 2020-06-21 NOTE — Progress Notes (Signed)
Pt had vaginal birth 03/22/20. Pt received depo before leaving hospital on 03/23/20. Pt considering Mirena IUD for birth control.

## 2020-06-21 NOTE — Progress Notes (Signed)
Post Partum Exam  Gabrielle Howell is a 24 y.o.SBF nonsmoker G3P3003 (5,1, baby) female who presents for a postpartum visit. She is 13 weeks postpartum following a spontaneous vaginal delivery on 03/22/20. I have fully reviewed the prenatal and intrapartum course. The delivery was at 39 4/7 gestational weeks.SROM. 5#13 M.  Anesthesia: none. Social worker saw pt on 03/22/20 for MJ use during pregnancy. Given DMPA while in hospital on 03/23/20. Postpartum course has been wnl. Baby's course has been wnl. Baby is feeding by Bottle Bleeding no bleeding. Bowel function is normal. Bladder function is normal. Patient is sexually active. Contraception method is Depo-Provera injections. Living with her 3 kids and FOB helps with kids.  Bottlefeeding q 2 hours (6 oz).  Baby weighed 10 lbs on 04/2020.  To begin working 07/20/20.  Last sex 06/17/20 without condom; with current partner x 5 years.  Last pap 12/01/18.  Denies cigs or vaping.  Lst ETOH 06/19/20 (1 mixed drink) 2x/wk. No menses pp.  Postpartum depression screening:  Edinburgh Postnatal Depression Scale - 06/21/20 0859      Edinburgh Postnatal Depression Scale:  In the Past 7 Days   I have been able to laugh and see the funny side of things. 0    I have looked forward with enjoyment to things. 0    I have blamed myself unnecessarily when things went wrong. 0    I have been anxious or worried for no good reason. 0    I have felt scared or panicky for no good reason. 0    Things have been getting on top of me. 0    I have been so unhappy that I have had difficulty sleeping. 0    I have felt sad or miserable. 0    I have been so unhappy that I have been crying. 0    The thought of harming myself has occurred to me. 0    Edinburgh Postnatal Depression Scale Total 0            The following portions of the patient's history were reviewed and updated as appropriate: allergies, current medications, past family history, past medical history, past  social history, past surgical history and problem list. Last pap smear done 12/01/18 and was Normal  Review of Systems Pertinent items are noted in HPI.    Objective:  BP 117/80   Ht '5\' 5"'  (1.651 m) Comment: 65 and half inches  Wt 230 lb (104.3 kg)   Breastfeeding No Comment: no normal period since giving birth, had depo before leaving hospital after giving birth  BMI 38.27 kg/m   Gen: well appearing, NAD HEENT: no scleral icterus CV: RR Lung: Normal WOB Breast:performed-not indicated  Ext: warm well perfused  GU: vagina fair tone, pink, light pink malodorous leukorrhea but pt denies sxs vaginitis Uterus NSSC Rectal: performed -  not indicated       Assessment:    13 wk postpartum exam. Pap smear not done at today's visit.   Plan:   Essential components of care per ACOG recommendations for Comprehensive Postpartum exam:  1.  Mood and well being: Patient with negative depression screening today. Reviewed local resources for support. EPDS is low risk. Reviewed resources and that mood sx in first year after pregnancy are considered related to pregnancy and to reach out for help at [facility] if needed. Discussed [facility] as link to care and availability of LCSW for counseling  - Patient does not use tobacco. 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 Infant care and feeding:  -Patient currently breastmilk feeding? No If breastmilk feeding discussed return to work and pumping. If needed, patient was provided letter for work to allow for every 2-3 hr pumping breaks, and to be granted a private location to express breastmilk and refrigerated area to store breastmilk. Reviewed importance of draining breast regularly to support lactation. I  -Recommended patient engage with WIC/BFpeer counselors  -Counseled to sign new child up for [facility] services -Social determinants of health (SDOH) reviewed in EPIC.   3. Sexuality,  contraception and birth spacing  Contraception: Contraception counseling: Reviewed all forms of birth control options in the tiered based approach. available including abstinence; over the counter/barrier methods; hormonal contraceptive medication including pill, patch, ring, injection,contraceptive implant; hormonal and nonhormonal IUDs; permanent sterilization options including vasectomy and the various tubal sterilization modalities. Risks, benefits, and typical effectiveness rates were reviewed.  Questions were answered.  Written information was also given to the patient to review.  Patient desires DMPA, this was prescribed for patient. She will follow up in 11-13 wks for surveillance.  She was told to call with any further questions, or with any concerns about this method of contraception.  Emphasized use of condoms 100% of the time for STI prevention.  Patient was offered ECP. ECP was not accepted by the patient. ECP counseling was not given - see RN documentation  - Patient does not want a pregnancy in the next year.  Desired family size is 3 children.  - Reviewed forms of contraception in tiered fashion. Patient desired Depo-Provera today.   - Discussed birth spacing of 18 months  4. Sleep and fatigue -Encouraged family/partner/community support of 4 hrs of uninterrupted sleep to help with mood and fatigue  5. Physical Recovery  - Discussed patients delivery and complications - Patient had a 0 degree laceration, perineal healing reviewed. Patient expressed understanding - Patient has urinary incontinence? No  - Patient is safe to resume physical and sexual activity  6.  Health Maintenance/Chronic Disease - Last pap smear performed 12/01/18 and was normal with negative HPV.   1. Postpartum exam  Received DMPA while in hospital on 03/23/20 and wants today. Pt desires to schedule Mirena insertion apt--please assist pt DMPA 150 mg IM q 11-13 wks x 1 year  Patient given handout about PCP  care in the community Given MVI per family planning program guidelines and availability  Follow up in: 11-13 weeks or as needed.

## 2020-09-06 ENCOUNTER — Other Ambulatory Visit: Payer: Self-pay

## 2020-09-06 ENCOUNTER — Ambulatory Visit (LOCAL_COMMUNITY_HEALTH_CENTER): Payer: Medicaid Other

## 2020-09-06 VITALS — BP 111/72 | Ht 65.0 in | Wt 233.5 lb

## 2020-09-06 DIAGNOSIS — Z3009 Encounter for other general counseling and advice on contraception: Secondary | ICD-10-CM | POA: Diagnosis not present

## 2020-09-06 DIAGNOSIS — Z30013 Encounter for initial prescription of injectable contraceptive: Secondary | ICD-10-CM | POA: Diagnosis not present

## 2020-09-06 MED ORDER — MEDROXYPROGESTERONE ACETATE 150 MG/ML IM SUSP
150.0000 mg | Freq: Once | INTRAMUSCULAR | Status: AC
  Administered 2020-09-06: 150 mg via INTRAMUSCULAR

## 2020-09-06 NOTE — Progress Notes (Signed)
11 weeks post depo. DMPA 150mg  IM given R Deltoid per order by , CNM dated 06/21/2020. Tolerated well. Next depo due 11/22/2020, pt aware. 11/24/2020, RN

## 2020-11-22 ENCOUNTER — Other Ambulatory Visit: Payer: Self-pay

## 2020-11-22 ENCOUNTER — Ambulatory Visit (LOCAL_COMMUNITY_HEALTH_CENTER): Payer: Medicaid Other

## 2020-11-22 VITALS — BP 134/80 | Wt 238.0 lb

## 2020-11-22 DIAGNOSIS — Z3009 Encounter for other general counseling and advice on contraception: Secondary | ICD-10-CM

## 2020-11-22 DIAGNOSIS — Z30013 Encounter for initial prescription of injectable contraceptive: Secondary | ICD-10-CM

## 2020-11-22 MED ORDER — MEDROXYPROGESTERONE ACETATE 150 MG/ML IM SUSP
150.0000 mg | Freq: Once | INTRAMUSCULAR | Status: AC
  Administered 2020-11-22: 150 mg via INTRAMUSCULAR

## 2020-11-22 NOTE — Progress Notes (Signed)
Depo given per E. Sciora CNM 06/21/20; tolerated well Richmond Campbell, RN

## 2021-02-18 ENCOUNTER — Other Ambulatory Visit: Payer: Self-pay

## 2021-02-18 ENCOUNTER — Ambulatory Visit (LOCAL_COMMUNITY_HEALTH_CENTER): Payer: Medicaid Other

## 2021-02-18 VITALS — BP 111/70 | HR 74 | Ht 65.0 in | Wt 247.0 lb

## 2021-02-18 DIAGNOSIS — Z3009 Encounter for other general counseling and advice on contraception: Secondary | ICD-10-CM | POA: Diagnosis not present

## 2021-02-18 DIAGNOSIS — Z30013 Encounter for initial prescription of injectable contraceptive: Secondary | ICD-10-CM | POA: Diagnosis not present

## 2021-02-18 MED ORDER — MEDROXYPROGESTERONE ACETATE 150 MG/ML IM SUSP
150.0000 mg | Freq: Once | INTRAMUSCULAR | Status: AC
  Administered 2021-02-18: 150 mg via INTRAMUSCULAR

## 2021-02-18 NOTE — Progress Notes (Signed)
Depo administered per 06/21/2020 written order of E. Sciora CNM and client tolerated without complaint. Jossie Ng, RN

## 2021-05-16 ENCOUNTER — Ambulatory Visit: Payer: Medicaid Other

## 2021-06-09 ENCOUNTER — Ambulatory Visit (LOCAL_COMMUNITY_HEALTH_CENTER): Payer: Medicaid Other | Admitting: Physician Assistant

## 2021-06-09 ENCOUNTER — Other Ambulatory Visit: Payer: Self-pay

## 2021-06-09 VITALS — BP 119/72 | Ht 67.0 in | Wt 248.6 lb

## 2021-06-09 DIAGNOSIS — Z3009 Encounter for other general counseling and advice on contraception: Secondary | ICD-10-CM

## 2021-06-09 DIAGNOSIS — Z30016 Encounter for initial prescription of transdermal patch hormonal contraceptive device: Secondary | ICD-10-CM | POA: Diagnosis not present

## 2021-06-09 MED ORDER — XULANE 150-35 MCG/24HR TD PTWK: 1.0000 | MEDICATED_PATCH | TRANSDERMAL | 3 refills | Status: DC

## 2021-06-12 NOTE — Progress Notes (Signed)
   [facility]  Subjective:  Gabrielle Howell is a 25 y.o. being seen today to discuss a change to another method.  Chief Complaint  Patient presents with   Contraception    Change BC    HPI Patient states that she does not want to continue with Depo as her BCM, but would like to change to the patch.  Patient is 15 wk 6 d since her last Depo.  Denies any other concerns.  Does the patient have a current or past history of drug use? No   No components found for: HCV]   Health Maintenance Due  Topic Date Due   COVID-19 Vaccine (1) Never done   HPV VACCINES (1 - 2-dose series) Never done   Hepatitis C Screening  Never done   CHLAMYDIA SCREENING  03/05/2021   INFLUENZA VACCINE  Never done    Review of Systems  All other systems reviewed and are negative.  The following portions of the patient's history were reviewed and updated as appropriate: allergies, current medications, past family history, past medical history, past social history, past surgical history and problem list. Problem list updated.   See flowsheet for other program required questions.  Objective:   Vitals:   06/09/21 1352  BP: 119/72  Weight: 248 lb 9.6 oz (112.8 kg)  Height: 5\' 7"  (1.702 m)    Physical Exam Constitutional:      General: She is not in acute distress.    Appearance: Normal appearance.  HENT:     Head: Normocephalic and atraumatic.  Eyes:     Conjunctiva/sclera: Conjunctivae normal.  Pulmonary:     Effort: Pulmonary effort is normal.  Skin:    General: Skin is warm and dry.  Neurological:     Mental Status: She is alert and oriented to person, place, and time.  Psychiatric:        Mood and Affect: Mood normal.        Behavior: Behavior normal.        Thought Content: Thought content normal.        Judgment: Judgment normal.      Assessment and Plan:  Gabrielle Howell is a 25 y.o. female presenting to  the [facility] Department for a Women's Health problem visit  1. Encounter for counseling regarding contraception Reviewed with patient re: BCM options. Reviewed with patient normal SE of Xulane and when to call clinic with concerns. Enc condoms with all sex for STD protection.   2. Encounter for prescription for transdermal contraceptive Rx for Xulane sent to patient's pharmacy of choice with refills x 3. Counseled patient that she can start the patch as soon as she picks it up from the pharmacy. Counseled patient that she should call when she picks up her last refill and schedule for her annual exam/visit. - norelgestromin-ethinyl estradiol [facility]) 150-35 MCG/24HR transdermal patch; Place 1 patch onto the skin once a week.  Dispense: 3 patch; Refill: 3     Return in about 3 months (around 09/08/2021) for RP and prn.  No future appointments.  14/08/2021, PA

## 2021-10-23 ENCOUNTER — Ambulatory Visit: Payer: Medicaid Other

## 2021-10-30 ENCOUNTER — Ambulatory Visit (LOCAL_COMMUNITY_HEALTH_CENTER): Payer: Medicaid Other | Admitting: Family Medicine

## 2021-10-30 ENCOUNTER — Other Ambulatory Visit: Payer: Self-pay

## 2021-10-30 VITALS — BP 113/72 | Ht 67.0 in | Wt 260.2 lb

## 2021-10-30 DIAGNOSIS — Z30011 Encounter for initial prescription of contraceptive pills: Secondary | ICD-10-CM | POA: Diagnosis not present

## 2021-10-30 DIAGNOSIS — Z1239 Encounter for other screening for malignant neoplasm of breast: Secondary | ICD-10-CM | POA: Diagnosis not present

## 2021-10-30 DIAGNOSIS — Z0000 Encounter for general adult medical examination without abnormal findings: Secondary | ICD-10-CM

## 2021-10-30 DIAGNOSIS — Z3009 Encounter for other general counseling and advice on contraception: Secondary | ICD-10-CM | POA: Diagnosis not present

## 2021-10-30 MED ORDER — NORGESTIMATE-ETH ESTRADIOL 0.25-35 MG-MCG PO TABS: 1.0000 | ORAL_TABLET | Freq: Every day | ORAL | 12 refills | Status: DC

## 2021-10-30 NOTE — Progress Notes (Signed)
[facility] DEPARTMENT [facility] [address] Main Number: [phone]  Family Planning Visit- Repeat Yearly Visit  Subjective:  Gabrielle Howell is a 26 y.o. [phone]  being seen today for an annual wellness visit and to discuss contraception options.   The patient is currently using No Method - No Contraceptive Precautions for pregnancy prevention. Patient does not want a pregnancy in the next year. 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  Chief Complaint  Patient presents with   Contraception    PE and [facility]    Patient reports here for physical, reports weight gain and edema in feet on occasion   Patient denies any other concerns    See flowsheet for other program required questions.   Body mass index is 40.75 kg/m. - Patient is eligible for diabetes screening based on BMI and age >79?  not applicable HA1C ordered? not applicable  Patient reports 1 of partners in last year. Desires STI screening?  No - declined    Has patient been screened once for HCV in the past?  No  No results found for: HCVAB  Does the patient have current of drug use, have a partner with drug use, and/or has been incarcerated since last result? No  If yes-- Screen for HCV through [facility]   Does the patient meet criteria for HBV testing? No  Criteria:  -Household, sexual or needle sharing contact with HBV -History of drug use -HIV positive -Those with known Hep C   Health Maintenance Due  Topic Date Due   COVID-19 Vaccine (1) Never done   HPV VACCINES (1 - 2-dose series) Never done   Hepatitis C Screening  Never done   CHLAMYDIA SCREENING  03/05/2021   INFLUENZA VACCINE  Never done   PAP-Cervical Cytology Screening  11/30/2021   PAP SMEAR-Modifier  11/30/2021    Review of Systems   Constitutional:  Negative for chills, fever, malaise/fatigue and weight loss.  HENT:  Negative for congestion, hearing loss and sore throat.   Eyes:  Negative for blurred vision, double vision and photophobia.  Respiratory:  Negative for shortness of breath.   Cardiovascular:  Negative for chest pain.  Gastrointestinal:  Negative for abdominal pain, blood in stool, constipation, diarrhea, heartburn, nausea and vomiting.  Genitourinary:  Negative for dysuria and frequency.  Musculoskeletal:  Negative for back pain, joint pain and neck pain.  Skin:  Negative for itching and rash.  Neurological:  Negative for dizziness, weakness and headaches.  Endo/Heme/Allergies:  Does not bruise/bleed easily.  Psychiatric/Behavioral:  Negative for depression, substance abuse and suicidal ideas.    The following portions of the patient's history were reviewed and updated as appropriate: allergies, current medications, past family history, past medical history, past social history, past surgical history and problem list. Problem list updated.  Objective:   Vitals:   10/30/21 0828  BP: 113/72  Weight: 260 lb 3.2 oz (118 kg)  Height: 5\' 7"  (1.702 m)    Physical Exam Vitals and nursing note reviewed.  Constitutional:      Appearance: Normal appearance.  HENT:     Head: Normocephalic and atraumatic.     Mouth/Throat:     Mouth: Mucous membranes are moist.     Pharynx: No oropharyngeal exudate or posterior oropharyngeal erythema.  Eyes:     General: No scleral icterus.  Neck:     Thyroid: No thyroid mass, thyromegaly or thyroid tenderness.      Comments: Tennis ball size firm, non-movable ump at base of neck .  Pt reports been there for ~1 year.   Cardiovascular:     Rate and Rhythm: Normal rate.     Pulses: Normal pulses.  Pulmonary:     Effort: Pulmonary effort is normal.  Chest:     Comments: Breasts:        Right: Normal. No swelling, mass, nipple discharge, skin change or tenderness.         Left: Normal. No swelling, mass, nipple discharge, skin change or tenderness.   Abdominal:     General: Abdomen is flat. Bowel sounds are normal.     Palpations: Abdomen is soft.  Genitourinary:    Comments: Deferred  Musculoskeletal:        General: Normal range of motion.  Skin:    General: Skin is warm and dry.  Neurological:     General: No focal deficit present.     Mental Status: She is alert.      Assessment and Plan:  Gabrielle Howell is a 26 y.o. female [phone] presenting to the [facility] Department for an yearly wellness and contraception visit   1. Routine general medical examination at a health care facility Well woman exam  Pap due 11/2021- not done today due to insurance, pt to return to clinic for PAP.   CBE- today   Pt to establish PCP so referral can be placed for lump on neck.  Pt to call back with PCP office she chooses.   2. Encounter for initial prescription of contraceptive pills  - norgestimate-ethinyl estradiol (ORTHO-CYCLEN) 0.25-35 MG-MCG tablet; Take 1 tablet by mouth daily.  Dispense: 28 tablet; Refill: 12  3. Encounter for screening breast examination and discussion of breast self examination Taught pt how to do self breast exam, teach back done.   Pt demonstrated the correct method to check breast.    4. Family planning counseling  Contraception counseling: Reviewed all forms of birth control options in the tiered based approach. available including abstinence; over the counter/barrier methods; hormonal contraceptive medication including pill, patch, ring, injection,contraceptive implant, ECP; hormonal and nonhormonal IUDs; permanent sterilization options including vasectomy and the various tubal sterilization modalities. Risks, benefits, and typical effectiveness rates were reviewed.  Questions were answered.  Written information was also given to the patient to review.  Patient desires OCP's , this was prescribed for patient. She  will follow up in  as needed  for surveillance.   she/her/hers was told to call with any further questions, or with any concerns about this method of contraception.  Emphasized use of condoms 100% of the time for STI prevention.  Patient was not offered ECP based on last sex.   No follow-ups on file.  No future appointments.  Wendi Snipes, FNP

## 2021-10-30 NOTE — Progress Notes (Signed)
Pt here for PE and BC.  Rx sent to pt's Pharmacy, by Provider.  Berdie Ogren, RN

## 2022-05-25 NOTE — Progress Notes (Signed)
[facility] received report from [facility] regarding syphilis testing.  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  No documentation of any tx in document/report. Phone call placed to [facility] to verify tx status/notification to pt. Spoke with Shanda Bumps, RN. Pt was treated with Bicillin LA 2.4 MU on 05/22/22, one time dose.

## 2023-01-19 ENCOUNTER — Encounter: Payer: Self-pay | Admitting: Advanced Practice Midwife

## 2023-01-19 ENCOUNTER — Ambulatory Visit (LOCAL_COMMUNITY_HEALTH_CENTER): Payer: Medicaid Other | Admitting: Advanced Practice Midwife

## 2023-01-19 ENCOUNTER — Ambulatory Visit: Payer: Medicaid Other

## 2023-01-19 VITALS — BP 114/73 | Ht 67.0 in | Wt 217.4 lb

## 2023-01-19 DIAGNOSIS — Z3009 Encounter for other general counseling and advice on contraception: Secondary | ICD-10-CM | POA: Diagnosis not present

## 2023-01-19 DIAGNOSIS — Z30430 Encounter for insertion of intrauterine contraceptive device: Secondary | ICD-10-CM | POA: Diagnosis not present

## 2023-01-19 DIAGNOSIS — Z30014 Encounter for initial prescription of intrauterine contraceptive device: Secondary | ICD-10-CM

## 2023-01-19 DIAGNOSIS — Z3202 Encounter for pregnancy test, result negative: Secondary | ICD-10-CM

## 2023-01-19 DIAGNOSIS — F17200 Nicotine dependence, unspecified, uncomplicated: Secondary | ICD-10-CM

## 2023-01-19 DIAGNOSIS — J45909 Unspecified asthma, uncomplicated: Secondary | ICD-10-CM

## 2023-01-19 DIAGNOSIS — N760 Acute vaginitis: Secondary | ICD-10-CM

## 2023-01-19 DIAGNOSIS — B9689 Other specified bacterial agents as the cause of diseases classified elsewhere: Secondary | ICD-10-CM

## 2023-01-19 DIAGNOSIS — Z30018 Encounter for initial prescription of other contraceptives: Secondary | ICD-10-CM

## 2023-01-19 LAB — PREGNANCY, URINE: Preg Test, Ur: NEGATIVE

## 2023-01-19 LAB — WET PREP FOR TRICH, YEAST, CLUE
Trichomonas Exam: NEGATIVE
Yeast Exam: NEGATIVE

## 2023-01-19 LAB — HM HIV SCREENING LAB: HM HIV Screening: NEGATIVE

## 2023-01-19 MED ORDER — METRONIDAZOLE 500 MG PO TABS: 500.0000 mg | ORAL_TABLET | Freq: Two times a day (BID) | ORAL | 0 refills | Status: AC

## 2023-01-19 MED ORDER — LEVONORGESTREL 20 MCG/DAY IU IUD
1.0000 | INTRAUTERINE_SYSTEM | Freq: Once | INTRAUTERINE | Status: AC
  Administered 2023-01-19: 1 via INTRAUTERINE

## 2023-01-19 NOTE — Addendum Note (Signed)
Addended by: Berdie Ogren on: 01/19/2023 11:18 AM   Modules accepted: Orders

## 2023-01-19 NOTE — Progress Notes (Signed)
Pt is here for PE, pap smear, STD testing and an IUD.  Wet mount results reviewed.  The patient was dispensed Metronidazole 500 mg #14 today. I provided counseling today regarding the medication. We discussed the medication, the side effects and when to call clinic. Patient given the opportunity to ask questions. Questions answered.  IUD inserted by Provider without any complications.  FP packet given.  Berdie Ogren, RN

## 2023-01-19 NOTE — Progress Notes (Signed)
[employer] [employer] DEPARTMENT [facility] [address] Main Number: [phone]    Family Planning Visit- Initial Visit  Subjective:  Gabrielle Howell is a 27 y.o. separated BF smoker [id number] (7,4,2)  being seen today for an initial annual visit and to discuss reproductive life planning.  The patient is currently using Female Condom for pregnancy prevention. Patient reports she/her/hers  does not want a pregnancy in the next year.    she/her/hers report they are looking for a method that provides High efficacy at preventing pregnancy  Patient has the following medical conditions has Obesity BMI=38.2 217 lbs; Patient noncompliance--no prenatal care x 8 weeks; and Positive urine drug screen 02/12/20 +MJ; +MJ 03/12/20 on their problem list.  Chief Complaint  Patient presents with   Annual Exam    Physical, STI screening, and IUD placement    Patient reports here for physical, pap, IUD placement. LMP 12/28/22. Last sex 01/08/23 with condom; with current partner x 5 mo; 1 partner in last 3 mo. Took last Tri Sprintec first week of march 2024. Last MJ 12/05/22. Last ETOH 01/16/23 (1 mixed drink). Last dental exam 2023. Not working, not in school. Living with her 3 kids. Last physical 10/30/2021. Last pap 12/01/2018 neg.   Patient denies cigs, vaping, cigars  Body mass index is 34.05 kg/m. - Patient is eligible for diabetes screening based on BMI> 25 and age >35?  not applicable HA1C ordered? not applicable  Patient reports 2  partner/s in last year. Desires STI screening?  Yes  Has patient been screened once for HCV in the past?  No  No results found for: "HCVAB"  Does the patient have current drug use (including MJ), have a partner with drug use, and/or has been incarcerated since last result? Yes  If yes-- Screen for HCV through [facility]   Does the patient meet criteria for HBV testing? No  Criteria:  -Household, sexual or needle sharing contact with  HBV -History of drug use -HIV positive -Those with known Hep C   Health Maintenance Due  Topic Date Due   COVID-19 Vaccine (1) Never done   HPV VACCINES (1 - 2-dose series) Never done   Hepatitis C Screening  Never done   PAP-Cervical Cytology Screening  11/30/2021   PAP SMEAR-Modifier  11/30/2021    Review of Systems  All other systems reviewed and are negative.   The following portions of the patient's history were reviewed and updated as appropriate: allergies, current medications, past family history, past medical history, past social history, past surgical history and problem list. Problem list updated.   See flowsheet for other program required questions.  Objective:   Vitals:   01/19/23 0850  BP: 114/73  Weight: 217 lb 6.4 oz (98.6 kg)  Height:  (1.702 m)    Physical Exam Constitutional:      Appearance: Normal appearance. She is obese.  HENT:     Head: Normocephalic and atraumatic.     Mouth/Throat:     Mouth: Mucous membranes are moist.  Eyes:     Conjunctiva/sclera: Conjunctivae normal.  Neck:     Thyroid: No thyroid mass, thyromegaly or thyroid tenderness.  Cardiovascular:     Rate and Rhythm: Normal rate and regular rhythm.  Pulmonary:     Effort: Pulmonary effort is normal.     Breath sounds: Normal breath sounds.  Abdominal:     Palpations: Abdomen is soft.     Comments: Soft without masses  or tenderness, fair tone  Genitourinary:    General: Normal vulva.     Exam position: Lithotomy position.     Vagina: Vaginal discharge (white creamy leukorrhea, ph<4.5) present.     Cervix: Normal.     Uterus: Normal.      Adnexa: Right adnexa normal and left adnexa normal.     Rectum: Normal.     Comments: Pap done Musculoskeletal:        General: Normal range of motion.     Cervical back: Normal range of motion and neck supple.  Skin:    General: Skin is warm and dry.  Neurological:     Mental Status: She is alert.  Psychiatric:        Mood  and Affect: Mood normal.       Assessment and Plan:  Gabrielle Howell is a 27 y.o. female presenting to the [facility] Department for an initial annual wellness/contraceptive visit  Contraception counseling: Reviewed options based on patient desire and reproductive life plan. Patient is interested in IUD or IUS. This was provided to the patient today.  if not why not clearly documented  Risks, benefits, and typical effectiveness rates were reviewed.  Questions were answered.  Written information was also given to the patient to review.    The patient will follow up in  1 years for surveillance.  The patient was told to call with any further questions, or with any concerns about this method of contraception.  Emphasized use of condoms 100% of the time for STI prevention.  Need for ECP was assessed. Patient reported > 120 hours .  Reviewed options and patient desired LNG -IUD   1. Encounter for initial prescription of intrauterine contraceptive device (IUD) Desires Mirena insertion today - Pregnancy, urine  2. Family planning Treat wet mount per standing orders Immunization nurse consult Please give pt dental list  - Syphilis Serology, [place] [facility] - [facility] [place] [facility] - Chlamydia/Gonorrhea [facility] - IGP, rfx Aptima HPV ASCU - WET PREP FOR TRICH, YEAST, CLUE  3. Encounter for initial prescription of other contraceptives  Pt desires Mirena insertion today  Patient presented to [facility] for IUD insertion. Her GC/CT screening was found to be up to date and using WHO criteria we can be reasonably certain she is not pregnant or a pregnancy test was obtained which was Urine pregnancy test  today was Negative.  See Flowsheet for IUD check list  IUD Insertion Procedure Note Patient identified, informed consent performed, consent signed.   Discussed risks of irregular bleeding, cramping, infection, malpositioning or misplacement of the IUD outside the uterus which may  require further procedure such as laparoscopy. Time out was performed.    Speculum placed in the vagina.  Cervix visualized.  Cleaned with Betadine x 2.  Cx Grasped anteriorly with a single tooth tenaculum.  Uterus sounded to 7 cm.  IUD placed per manufacturer's recommendations.  Strings trimmed to 3 cm. Tenaculum was removed, good hemostasis noted.  Patient tolerated procedure well.   Patient was given post-procedure instructions- both agency handout and verbally by provider.  She was advised to have backup contraception for one week.  Patient was also asked to check IUD strings periodically or follow up in 4 weeks for IUD check.      No follow-ups on file.  No future appointments.  Alberteen Spindle, CNM

## 2023-01-23 LAB — IGP, RFX APTIMA HPV ASCU: PAP Smear Comment: 0

## 2023-01-25 ENCOUNTER — Emergency Department
Admission: EM | Admit: 2023-01-25 | Discharge: 2023-01-25 | Disposition: A | Discharge status: Home / Self Care | Payer: Self-pay | Attending: Emergency Medicine | Admitting: Emergency Medicine

## 2023-01-25 ENCOUNTER — Emergency Department: Payer: Self-pay

## 2023-01-25 ENCOUNTER — Other Ambulatory Visit: Payer: Self-pay

## 2023-01-25 DIAGNOSIS — M5442 Lumbago with sciatica, left side: Secondary | ICD-10-CM | POA: Insufficient documentation

## 2023-01-25 DIAGNOSIS — X501XXA Overexertion from prolonged static or awkward postures, initial encounter: Secondary | ICD-10-CM | POA: Insufficient documentation

## 2023-01-25 DIAGNOSIS — J45909 Unspecified asthma, uncomplicated: Secondary | ICD-10-CM | POA: Insufficient documentation

## 2023-01-25 LAB — URINALYSIS, ROUTINE W REFLEX MICROSCOPIC
Bilirubin Urine: NEGATIVE
Glucose, UA: NEGATIVE mg/dL
Hgb urine dipstick: NEGATIVE
Ketones, ur: NEGATIVE mg/dL
Leukocytes,Ua: NEGATIVE
Nitrite: NEGATIVE
Protein, ur: NEGATIVE mg/dL
Specific Gravity, Urine: 1.025 (ref 1.005–1.030)
pH: 6 (ref 5.0–8.0)

## 2023-01-25 LAB — POC URINE PREG, ED: Preg Test, Ur: NEGATIVE

## 2023-01-25 MED ORDER — BACLOFEN 10 MG PO TABS: 10.0000 mg | ORAL_TABLET | Freq: Three times a day (TID) | ORAL | 0 refills | Status: AC

## 2023-01-25 MED ORDER — KETOROLAC TROMETHAMINE 30 MG/ML IJ SOLN
30.0000 mg | Freq: Once | INTRAMUSCULAR | Status: AC
  Administered 2023-01-25: 30 mg via INTRAMUSCULAR
  Filled 2023-01-25: qty 1

## 2023-01-25 MED ORDER — MELOXICAM 15 MG PO TABS: 15.0000 mg | ORAL_TABLET | Freq: Every day | ORAL | 2 refills | Status: AC

## 2023-01-25 NOTE — ED Provider Notes (Signed)
[facility] Provider Note    Event Date/Time   First MD Initiated Contact with Patient 01/25/23 [phone]     (approximate)   History   Back Pain   HPI  Gabrielle Howell is a 27 y.o. female with history of asthma presents emergency department complaining of low back pain radiates to the left leg.  Patient states she has difficulty going from sitting to standing and bending over.  States original injury happened while she was bending over helping a kid and felt a pop in her lower back.  No numbness.  No loss of bowel or bladder control.  Symptoms x 2 days      Physical Exam   Triage Vital Signs: ED Triage Vitals  Enc Vitals Group     BP 01/25/23 0945 131/84     Pulse Rate 01/25/23 0945 81     Resp 01/25/23 0945 14     Temp 01/25/23 0945 98.3 F (36.8 C)     Temp src --      SpO2 01/25/23 0945 97 %     Weight 01/25/23 0946 213 lb (96.6 kg)     Height 01/25/23 0946 5\' 7"  (1.702 m)     Head Circumference --      Peak Flow --      Pain Score 01/25/23 0945 9     Pain Loc --      Pain Edu? --      Excl. in GC? --     Most recent vital signs: Vitals:   01/25/23 0945  BP: 131/84  Pulse: 81  Resp: 14  Temp: 98.3 F (36.8 C)  SpO2: 97%     General: Awake, no distress.   CV:  Good peripheral perfusion. regular rate and  rhythm Resp:  Normal effort.  Abd:  No distention.   Other:  Lumbar spine tender along the paravertebral muscles and SI joint, patient is able to ambulate, pain reproduced with forward flexion and hyperextension, neurovascular is intact, 5 out of 5 strength lower extremities   ED Results / Procedures / Treatments   Labs (all labs ordered are listed, but only abnormal results are displayed) Labs Reviewed  URINALYSIS, ROUTINE W REFLEX MICROSCOPIC - Abnormal; Notable for the following components:      Result Value   Color, Urine YELLOW (*)    APPearance HAZY (*)    All other components within normal limits  POC URINE  PREG, ED     EKG     RADIOLOGY X-ray lumbar spine    PROCEDURES:   Procedures   MEDICATIONS ORDERED IN ED: Medications  ketorolac (TORADOL) 30 MG/ML injection 30 mg (30 mg Intramuscular Given 01/25/23 1010)     IMPRESSION / MDM / ASSESSMENT AND PLAN / ED COURSE  I reviewed the triage vital signs and the nursing notes.                              Differential diagnosis includes, but is not limited to, lumbar radiculopathy, sciatica, muscle strain, spasm, UTI  Patient's presentation is most consistent with acute complicated illness / injury requiring diagnostic workup.   X-ray of the lumbar spine, Toradol 30 mg IM, POC pregnancy test, UA ordered  Labs are reassuring  X-ray lumbar spine independently reviewed and interpreted by me as being negative for any acute abnormality  I did explain the findings to the patient.  She is given prescription  for meloxicam and baclofen.  She did have some relief with the Toradol injection.  Follow-up with your regular doctor if not improving in 3 to 5 days.  Return if worsening.  She is in agreement treatment plan.  Discharged stable condition.     FINAL CLINICAL IMPRESSION(S) / ED DIAGNOSES   Final diagnoses:  Acute left-sided back pain with sciatica     Rx / DC Orders   ED Discharge Orders          Ordered    meloxicam (MOBIC) 15 MG tablet  Daily        01/25/23 1056    baclofen (LIORESAL) 10 MG tablet  3 times daily        01/25/23 1056             Note:  This document was prepared using Dragon voice recognition software and may include unintentional dictation errors.    Faythe Ghee, PA-C 01/25/23 1058    Jene Every, MD 01/25/23 [phone]

## 2023-01-25 NOTE — Discharge Instructions (Signed)
Follow-up with your regular doctor if not improving in 3-5 days.  Return if worsening.  Take medication as prescribed. Apply ice to your lower back Gentle stretches For the pillow in between your legs when lying on your side, underneath your knees if lying on your back

## 2023-01-25 NOTE — ED Triage Notes (Signed)
Pt states yesterday she bent over and started to have back pain. Pt states left lower back pain that radiates down the left leg. No known trauma.

## 2023-08-22 ENCOUNTER — Other Ambulatory Visit: Payer: Self-pay

## 2023-08-22 ENCOUNTER — Emergency Department
Admission: EM | Admit: 2023-08-22 | Discharge: 2023-08-22 | Disposition: A | Discharge status: Home / Self Care | Payer: BC Managed Care – PPO | Attending: Emergency Medicine | Admitting: Emergency Medicine

## 2023-08-22 DIAGNOSIS — J45909 Unspecified asthma, uncomplicated: Secondary | ICD-10-CM | POA: Insufficient documentation

## 2023-08-22 DIAGNOSIS — S81831A Puncture wound without foreign body, right lower leg, initial encounter: Secondary | ICD-10-CM | POA: Insufficient documentation

## 2023-08-22 DIAGNOSIS — S8991XA Unspecified injury of right lower leg, initial encounter: Secondary | ICD-10-CM | POA: Diagnosis present

## 2023-08-22 DIAGNOSIS — S81851A Open bite, right lower leg, initial encounter: Secondary | ICD-10-CM | POA: Diagnosis not present

## 2023-08-22 DIAGNOSIS — W540XXA Bitten by dog, initial encounter: Secondary | ICD-10-CM | POA: Diagnosis not present

## 2023-08-22 MED ORDER — AMOXICILLIN-POT CLAVULANATE 875-125 MG PO TABS: 1.0000 | ORAL_TABLET | Freq: Two times a day (BID) | ORAL | 0 refills | Status: AC

## 2023-08-22 NOTE — ED Triage Notes (Signed)
Pt to ed from home via POV for a dog bite to the back of her right calf. Pt was returning some jumper cables to her neighbor and their dog ran out and bit her on the back of the calf. Pt is caox4, in no acute distress and ambulatory in triage. Pt has two small puncture wounds on the back of her calf with bleeding controlled.

## 2023-08-22 NOTE — ED Notes (Signed)
Animal control called back. They took all info and will follow up with the patient vie phone call.

## 2023-08-22 NOTE — ED Notes (Signed)
This RN called [facility]. Animal control will call me back.

## 2023-08-22 NOTE — ED Notes (Signed)
Wound flushed with NS, no bleeding noted after cleaned.

## 2023-08-22 NOTE — ED Notes (Signed)
Pt verbalizes understanding of discharge instructions. Opportunity for questioning and answers were provided. Pt discharged from ED to home with daughter.

## 2023-08-22 NOTE — ED Provider Notes (Signed)
[facility] Provider Note    Event Date/Time   First MD Initiated Contact with Patient 08/22/23 1949     (approximate)   History   Animal Bite (dog)   HPI  Gabrielle Howell is a 27 y.o. female who presents today for evaluation of dog bite to her right calf.  Patient reports that this was her neighbors dog.  She reports that the dog is up-to-date on its vaccination including rabies vaccination.  Patient reports that she is also up-to-date with her tetanus vaccination.  She denies significant pain.  Patient Active Problem List   Diagnosis Date Noted   Asthma 01/19/2023   Positive urine drug screen 02/12/20 +MJ; +MJ 03/12/20 02/16/2020   Patient noncompliance--no prenatal care x 8 weeks 02/12/2020   Obesity BMI=38.2 217 lbs 12/01/2018          Physical Exam   Triage Vital Signs: ED Triage Vitals  Encounter Vitals Group     BP 08/22/23 1902 134/77     Systolic BP Percentile --      Diastolic BP Percentile --      Pulse Rate 08/22/23 1902 80     Resp 08/22/23 1902 19     Temp 08/22/23 1902 97.8 F (36.6 C)     Temp Source 08/22/23 1902 Oral     SpO2 08/22/23 1902 100 %     Weight --      Height 08/22/23 1905 5\' 7"  (1.702 m)     Head Circumference --      Peak Flow --      Pain Score 08/22/23 1905 4     Pain Loc --      Pain Education --      Exclude from Growth Chart --     Most recent vital signs: Vitals:   08/22/23 1902  BP: 134/77  Pulse: 80  Resp: 19  Temp: 97.8 F (36.6 C)  SpO2: 100%    Physical Exam Vitals and nursing note reviewed.  Constitutional:      General: Awake and alert. No acute distress.    Appearance: Normal appearance. The patient is obese.  HENT:     Head: Normocephalic and atraumatic.     Mouth: Mucous membranes are moist.  Eyes:     General: PERRL. Normal EOMs        Right eye: No discharge.        Left eye: No discharge.     Conjunctiva/sclera: Conjunctivae normal.  Cardiovascular:     Rate and  Rhythm: Normal rate and regular rhythm.     Pulses: Normal pulses.     Heart sounds: Normal heart sounds Pulmonary:     Effort: Pulmonary effort is normal. No respiratory distress.     Breath sounds: Normal breath sounds.  Abdominal:     Abdomen is soft. There is no abdominal tenderness. No rebound or guarding. No distention. Musculoskeletal:        General: No swelling. Normal range of motion.     Cervical back: Normal range of motion and neck supple.  Right calf with 2 less than 1 cm puncture wounds.  No active bleeding. Skin:    General: Skin is warm and dry.     Capillary Refill: Capillary refill takes less than 2 seconds.     Findings: No rash.  Neurological:     Mental Status: The patient is awake and alert.      ED Results / Procedures / Treatments  Labs (all labs ordered are listed, but only abnormal results are displayed) Labs Reviewed - No data to display   EKG     RADIOLOGY     PROCEDURES:  Critical Care performed:   Procedures   MEDICATIONS ORDERED IN ED: Medications - No data to display   IMPRESSION / MDM / ASSESSMENT AND PLAN / ED COURSE  I reviewed the triage vital signs and the nursing notes.   Differential diagnosis includes, but is not limited to, dog bite, contusion, hematoma.  Patient is awake and alert, hemodynamically stable and afebrile.  She is nontoxic in appearance.  RN reports that animal control was called.  Patient reports that she is up-to-date on her tetanus vaccination and the dog is up-to-date on the rabies vaccination.  The wounds were irrigated by the RN, and patient was started on Augmentin.  She is able to ambulate with a steady gait, unassisted.  We discussed the importance of keeping the area clean with soap and water multiple times per day.  She has no pain out of proportion, do not suspect muscle tear.  There is no visible hematoma noted.  There is no active bleeding.  She has normal range of motion of knee and ankle.   We discussed return precautions and outpatient follow-up.  Patient understands and agrees with plan.  She was discharged in stable condition.   Patient's presentation is most consistent with acute complicated illness / injury requiring diagnostic workup.    FINAL CLINICAL IMPRESSION(S) / ED DIAGNOSES   Final diagnoses:  Dog bite, initial encounter     Rx / DC Orders   ED Discharge Orders          Ordered    amoxicillin-clavulanate (AUGMENTIN) 875-125 MG tablet  2 times daily        08/22/23 2050             Note:  This document was prepared using Dragon voice recognition software and may include unintentional dictation errors.   Keturah Shavers 08/22/23 2057    Concha Se, MD 08/25/23 [phone]

## 2023-08-22 NOTE — Discharge Instructions (Signed)
Please take the antibiotics as prescribed.  Please keep this clean with soap and water, wash multiple times per day.  Please return for any new, worsening, or change in symptoms or other concerns.  It was a pleasure caring for you today.

## 2023-09-01 DIAGNOSIS — B3731 Acute candidiasis of vulva and vagina: Secondary | ICD-10-CM | POA: Diagnosis not present

## 2023-09-01 DIAGNOSIS — Z1389 Encounter for screening for other disorder: Secondary | ICD-10-CM | POA: Diagnosis not present

## 2023-09-01 DIAGNOSIS — M79661 Pain in right lower leg: Secondary | ICD-10-CM | POA: Diagnosis not present

## 2023-11-26 ENCOUNTER — Encounter: Payer: Self-pay | Admitting: Family Medicine

## 2023-11-26 ENCOUNTER — Ambulatory Visit: Payer: Medicaid Other

## 2023-11-26 DIAGNOSIS — Z8619 Personal history of other infectious and parasitic diseases: Secondary | ICD-10-CM | POA: Insufficient documentation

## 2023-11-26 DIAGNOSIS — Z113 Encounter for screening for infections with a predominantly sexual mode of transmission: Secondary | ICD-10-CM | POA: Diagnosis not present

## 2023-11-26 LAB — WET PREP FOR TRICH, YEAST, CLUE
Trichomonas Exam: NEGATIVE
Yeast Exam: NEGATIVE

## 2023-11-26 LAB — HM HIV SCREENING LAB: HM HIV Screening: NEGATIVE

## 2023-11-26 NOTE — Progress Notes (Signed)
 [facility] Department STI clinic [address] [place] [address] Main phone: [phone]  STI screening visit  Subjective:  Gabrielle Howell is a 28 y.o. female being seen today for an STI screening visit. The patient reports they do not have symptoms.  Patient reports that they do not desire a pregnancy in the next year.   They reported they are not interested in discussing contraception today.    Patient's last menstrual period was 11/07/2023 (exact date).  Patient has the following medical conditions:  Patient Active Problem List   Diagnosis Date Noted   History of syphilis 11/26/2023   Asthma 01/19/2023    Chief Complaint  Patient presents with   SEXUALLY TRANSMITTED DISEASE    Screening    HPI HPI Patient reports to clinic for STI screening- asymptomatic  Does the patient using douching products? No  Last HIV test per patient/review of record was  Lab Results  Component Value Date   HMHIVSCREEN Negative - Validated 01/19/2023    Lab Results  Component Value Date   HIV Non-reactive 02/12/2020     Last HEPC test per patient/review of record was No results found for: "HMHEPCSCREEN" No components found for: "HEPC"   Last HEPB test per patient/review of record was No components found for: "HMHEPBSCREEN"   Patient reports last pap was: 2024  No results found for: "DIAGPAP", "HPVHIGH", "ADEQPAP" Lab Results  Component Value Date   SPECADGYN Comment 01/19/2023   Result Date Procedure Results Follow-ups  01/19/2023 IGP, rfx Aptima HPV ASCU DIAGNOSIS:: Comment Specimen adequacy:: Comment Clinician Provided ICD10: Comment Performed by:: Comment PAP Smear Comment: . Note:: Comment Test Methodology: Comment PAP Reflex: Comment   12/01/2018 HM PAP SMEAR HM Pap smear: Negative     Screening for MPX risk: Does the patient have an unexplained rash? No Is the patient MSM? No Does the patient endorse multiple sex partners or  anonymous sex partners? No Did the patient have close or sexual contact with a person diagnosed with MPX? No Has the patient traveled outside the [place] where MPX is endemic? No Is there a high clinical suspicion for MPX-- evidenced by one of the following No  -Unlikely to be chickenpox  -Lymphadenopathy  -Rash that present in same phase of evolution on any given body part See flowsheet for further details and programmatic requirements.   Immunization history:  Immunization History  Administered Date(s) Administered   MMR 06/11/2015   Tdap 02/12/2020     The following portions of the patient's history were reviewed and updated as appropriate: allergies, current medications, past medical history, past social history, past surgical history and problem list.  Objective:  There were no vitals filed for this visit.  Physical Exam Vitals and nursing note reviewed.  Constitutional:      Appearance: Normal appearance.  HENT:     Head: Normocephalic.     Mouth/Throat:     Mouth: Mucous membranes are moist.  Cardiovascular:     Rate and Rhythm: Normal rate.  Pulmonary:     Effort: Pulmonary effort is normal.  Abdominal:     Palpations: Abdomen is soft.  Genitourinary:    Comments: Declined genital exam- no symptoms, self swabbed Musculoskeletal:        General: Normal range of motion.  Lymphadenopathy:     Head:     Right side of head: No submandibular, preauricular or posterior auricular adenopathy.     Left side of head: No submandibular, preauricular or  posterior auricular adenopathy.     Cervical: No cervical adenopathy.     Upper Body:     Right upper body: No supraclavicular or axillary adenopathy.     Left upper body: No supraclavicular or axillary adenopathy.  Skin:    General: Skin is warm and dry.  Neurological:     Mental Status: She is alert and oriented to person, place, and time.  Psychiatric:        Mood and Affect: Mood normal.     Assessment and Plan:   Gabrielle Howell is a 28 y.o. female presenting to the [facility] Department for STI screening  1. Screening for venereal disease (Primary)  - Chlamydia/Gonorrhea [place] [facility] - [facility] - Syphilis Serology, [facility] - WET PREP FOR TRICH, YEAST, CLUE  2. History of syphilis -unknown titer, Reports [facility]   Patient accepted all screenings including vaginal CT/GC and bloodwork for HIV/RPR, and wet prep. Patient meets criteria for HepB screening? No. Ordered? not applicable Patient meets criteria for HepC screening? No. Ordered? not applicable  Treat wet prep per standing order Discussed time line for [facility] results and that patient will be called with positive results and encouraged patient to call if she had not heard in 2 weeks.  Counseled to return or seek care for continued or worsening symptoms Recommended repeat testing in 3 months with positive results. Recommended condom use with all sex for STI prevention.   Patient is currently using *Mirena to prevent pregnancy.    Return if symptoms worsen or fail to improve, for STI screening.  No future appointments.  Lenice Llamas, [place]

## 2023-11-26 NOTE — Progress Notes (Signed)
 Pt is here for STD screening.  Wet mount results reviewed, no treatment required per SO.  Condoms declined.  Berdie Ogren, RN

## 2024-04-25 ENCOUNTER — Encounter: Payer: Self-pay | Admitting: Family Medicine

## 2024-04-25 ENCOUNTER — Ambulatory Visit: Admitting: Family Medicine

## 2024-04-25 VITALS — BP 121/82 | HR 74 | Ht 67.0 in | Wt 205.4 lb

## 2024-04-25 DIAGNOSIS — Z30011 Encounter for initial prescription of contraceptive pills: Secondary | ICD-10-CM

## 2024-04-25 DIAGNOSIS — Z30432 Encounter for removal of intrauterine contraceptive device: Secondary | ICD-10-CM | POA: Diagnosis not present

## 2024-04-25 DIAGNOSIS — Z309 Encounter for contraceptive management, unspecified: Secondary | ICD-10-CM | POA: Diagnosis not present

## 2024-04-25 MED ORDER — NORGESTIMATE-ETH ESTRADIOL 0.18/0.215/0.25 MG-25 MCG PO TABS: 1.0000 | ORAL_TABLET | Freq: Every day | ORAL | 3 refills | Status: AC

## 2024-04-25 MED ORDER — NORGESTIMATE-ETH ESTRADIOL 0.18/0.215/0.25 MG-25 MCG PO TABS: 1.0000 | ORAL_TABLET | Freq: Every day | ORAL | 3 refills | Status: DC

## 2024-04-25 NOTE — Progress Notes (Unsigned)
 [employer] HEALTH DEPARTMENT [facility] [address] [place] [address] Main phone: [phone]  Family Planning Visit - Repeat Yearly Visit  Subjective:  Gabrielle Howell is a 28 y.o. G3P3003  being seen today for an annual wellness visit and to discuss contraception options. The patient is currently using IUD (or IUS) for pregnancy prevention. Patient does not want a pregnancy in the next year.   Patient reports they are looking for a method with the following characteristics:  Discrete method Method they can control starting and stopping  Patient has the following medical problems:  Patient Active Problem List   Diagnosis Date Noted   History of syphilis 11/26/2023   Asthma 01/19/2023   Chief Complaint  Patient presents with   Annual Exam   HPI Patient reports she would like her physical, IUD removal, and new prescription of birth control pills.   No history of HTN, DVT, PE, stroke, migraine with aura, congenital clotting disorders, or smoking.  Would like lower dose pills to help mitigate any symptoms of nausea and fatigue.   Review of Systems  Constitutional:  Negative for fever and weight loss.  Respiratory:  Negative for shortness of breath.   Cardiovascular:  Negative for chest pain and palpitations.  (+) fatigue, nausea, malaise  See flowsheet for further details and programmatic requirements Hyperlink available at the top of the signed note in blue.  Flow sheet content below:  Pregnancy Intention Screening Does the patient want to become pregnant in the next year?: No Does the patient's partner want to become pregnant in the next year?: No Would the patient like to discuss contraceptive options today?: Yes Contraception History Past methods of contraception used by patient:: Hormonal Injection, Intrauterine device or system (IDU,IUS), Contraceptive Pill, Contraceptive Patch Adverse effects associated with  Contraceptive Pill: unknown Adverse effects associated with Hormonal Injection: weight gain Adverse effects associated with Intrauterine device or system (IUD,IUS): Feels off Adverse effects associated with Contraceptive Patch: sick Sexual History What age did you start your period?:  (unknown) How often do you have your period?:  (iud) Date of last sex?: 04/18/24 Has the patient had unprotected sex within the last 5 days?: No Do you have sex with men, women, both men and women?: Men only In the past 2 months how many partners have you had sex with?: 1 In the past 12 months, how many partners have you had sex with?: 1 Is it possible that any of your sex partners in the past 12 months had sex with someone else whild they were still in a sexual relationship with you?: No What ways do you have sex?: Vaginal Do you or your partner use condoms and/or dental dams every time you have vaginal, oral or anal sex?: No Do you douche?: No Date of last HIV test?: 11/26/23 Have you ever had an STD?: Yes Have any of your partners had an STD?: Yes Partner Previous STD?: Gonorrhea Date?:  (3-4 years ago) Have you or your partner ever shot up drugs?: No Have any of your partners used drugs in the past?: No Have you or your partners exchanged money or drugs for sex?: No Counseling Education: Make informed decision about family planning, Provided preconception counseling, Reduce risk of transmission and protection from STD's and HIV, Understand BMI >25 or >18.5 is a health risk (weight management educational materials to be provided to client requests), Promoted daily consumption of MVI with folic acid if capable of conceiving., Review immunization history,  inform client of recommended vaccines per CDC's ACIP Guidelines and refer to Immunization clinic, Results of physical assessment and labs (if performed), How to discontinue the method selected and information on back up method used, How to use the method  selected and information on back up method used, How to use the method consistently and correctly, Teach back method completed, Warning signs for rare but serious adverse events and what to do if they experience a warning sign (including emergency 24 hour number, where to seek emergency service outside of hours of operation), When to return for follow up (planned return schedule), PCP list given to patient Contraception Wrap Up Current Method: IUD or IUS End Method: Oral Contraceptive Contraception Counseling Provided: Yes How was the end contraceptive method provided?: Provided on site  Diabetes screening This patient is 28 y.o. with a BMI of Body mass index is 32.17 kg/m.SABRA  Is patient eligible for diabetes screening (age >35 and BMI >25)?  no  Was Hgb A1c ordered? not applicable  STI screening Patient reports 1 of partners in last year.  Does this patient desire STI screening?  No - declines  Hepatitis C screening Has patient been screened once for HCV in the past?  No  No results found for: HCVAB  Does the patient meet criteria for HCV testing? No   Hepatitis B screening Does the patient meet criteria for HBV testing? No  Cervical Cancer Screening  Result Date Procedure Results Follow-ups  01/19/2023 IGP, rfx Aptima HPV ASCU DIAGNOSIS:: Comment Specimen adequacy:: Comment Clinician Provided ICD10: Comment Performed by:: Comment PAP Smear Comment: . Note:: Comment Test Methodology: Comment PAP Reflex: Comment   12/01/2018 HM PAP SMEAR HM Pap smear: Negative     Health Maintenance Due  Topic Date Due   HPV VACCINES (1 - 3-dose series) Never done   Hepatitis C Screening  Never done   Pneumococcal Vaccine 38-67 Years old (1 of 2 - PCV) Never done   Hepatitis B Vaccines (1 of 3 - 19+ 3-dose series) Never done   COVID-19 Vaccine (1 - 2024-25 season) Never done    The following portions of the patient's history were reviewed and updated as appropriate: allergies, current  medications, past family history, past medical history, past social history, past surgical history and problem list. Problem list updated.  Objective:   Vitals:   04/25/24 1434  BP: 121/82  Pulse: 74  Weight: 205 lb 6.4 oz (93.2 kg)  Height: 5' 7 (1.702 m)   Physical Exam Vitals and nursing note reviewed. Exam conducted with a chaperone present.  Constitutional:      Appearance: Normal appearance.  HENT:     Head: Normocephalic.  Eyes:     General: No scleral icterus.       Right eye: No discharge.        Left eye: No discharge.     Conjunctiva/sclera: Conjunctivae normal.  Pulmonary:     Effort: Pulmonary effort is normal.  Abdominal:     General: Abdomen is flat.     Palpations: Abdomen is soft.  Musculoskeletal:        General: Normal range of motion.     Cervical back: Neck supple. No rigidity or tenderness.  Lymphadenopathy:     Head:     Right side of head: No submental, submandibular, preauricular or posterior auricular adenopathy.     Left side of head: No submental, submandibular, preauricular or posterior auricular adenopathy.     Cervical: No cervical adenopathy.  Right cervical: No superficial or posterior cervical adenopathy.    Left cervical: No superficial or posterior cervical adenopathy.     Upper Body:     Right upper body: No supraclavicular adenopathy.     Left upper body: No supraclavicular adenopathy.  Skin:    General: Skin is warm and dry.     Capillary Refill: Capillary refill takes less than 2 seconds.     Coloration: Skin is not jaundiced or pale.     Findings: No bruising, erythema, lesion or rash.  Neurological:     Mental Status: She is alert. Mental status is at baseline.  Psychiatric:        Mood and Affect: Mood normal.        Behavior: Behavior normal.    Procedure:  IUD Removal  Patient identified, informed consent performed, consent signed.  Patient was in the dorsal lithotomy position, normal external genitalia was noted.   A speculum was placed in the patient's vagina, normal discharge was noted, no lesions. The cervix was visualized, no lesions, no abnormal discharge.  The strings of the IUD were grasped and pulled using ring forceps. The IUD was removed in its entirety. Patient tolerated the procedure well.    Patient will use COC for contraception. .  Routine preventative health maintenance measures emphasized.  Assessment and Plan:  ATTIE NAWABI is a 28 y.o. female [phone] presenting to the [facility] Department for an yearly wellness and contraception visit  Contraception counseling:  Reviewed options based on patient desire and reproductive life plan. Patient is interested in Oral Contraceptive. This was provided to the patient today.   Risks, benefits, and typical effectiveness rates were reviewed.  Questions were answered.  Written information was also given to the patient to review.    The patient will follow up in  1 years for surveillance.  The patient was told to call with any further questions, or with any concerns about this method of contraception.  Emphasized use of condoms 100% of the time for STI prevention.  Emergency Contraception Precautions (ECP): Patient assessed for need of ECP. She is not a candidate based on LARC in place and unexpired .   Encounter for initial prescription of contraceptive pills -     Norgestimate -Eth Estradiol ; Take 1 tablet by mouth daily.  Dispense: 84 tablet; Refill: 3  No follow-ups on file.  No future appointments.  Betsey CHRISTELLA Helling, MD

## 2024-04-25 NOTE — Progress Notes (Signed)
 Pt is here for PE and IUD removal.  Pt tolerated removal with no complaints.  FP education card and dental list given to pt. Larraine JONELLE Northern, RN

## 2024-04-25 NOTE — Patient Instructions (Signed)
 IUD removal and birth control Today we removed your IUD.  You decided to switch to pills.  I have sent the prescription to your pharmacy. Please start them on the first day after your period.
